# Patient Record
Sex: Female | Born: 1935 | Race: White | Hispanic: No | Smoking: Former smoker
Health system: Southern US, Community
[De-identification: ages and names within clinical notes are randomized; demographics above are authoritative.]

## PROBLEM LIST (undated history)

## (undated) DIAGNOSIS — Z972 Presence of dental prosthetic device (complete) (partial): Secondary | ICD-10-CM

## (undated) DIAGNOSIS — E785 Hyperlipidemia, unspecified: Secondary | ICD-10-CM

## (undated) DIAGNOSIS — E079 Disorder of thyroid, unspecified: Secondary | ICD-10-CM

## (undated) DIAGNOSIS — H269 Unspecified cataract: Secondary | ICD-10-CM

## (undated) DIAGNOSIS — Z973 Presence of spectacles and contact lenses: Secondary | ICD-10-CM

## (undated) DIAGNOSIS — G5600 Carpal tunnel syndrome, unspecified upper limb: Secondary | ICD-10-CM

## (undated) HISTORY — DX: Hyperlipidemia, unspecified: E78.5

## (undated) HISTORY — PX: TONSILLECTOMY: SUR1361

## (undated) HISTORY — DX: Disorder of thyroid, unspecified: E07.9

## (undated) HISTORY — PX: OTHER SURGICAL HISTORY: SHX169

---

## 1978-06-04 HISTORY — PX: DILATION AND CURETTAGE OF UTERUS: SHX78

## 2000-03-22 ENCOUNTER — Other Ambulatory Visit: Admission: RE | Admit: 2000-03-22 | Discharge: 2000-03-22 | Payer: Self-pay | Admitting: Family Medicine

## 2002-06-16 ENCOUNTER — Other Ambulatory Visit: Admission: RE | Admit: 2002-06-16 | Discharge: 2002-06-16 | Payer: Self-pay | Admitting: Family Medicine

## 2004-02-11 ENCOUNTER — Other Ambulatory Visit: Admission: RE | Admit: 2004-02-11 | Discharge: 2004-02-11 | Payer: Self-pay | Admitting: Family Medicine

## 2004-04-14 ENCOUNTER — Ambulatory Visit: Payer: Self-pay | Admitting: Family Medicine

## 2004-06-09 ENCOUNTER — Ambulatory Visit: Payer: Self-pay | Admitting: Family Medicine

## 2004-09-22 ENCOUNTER — Ambulatory Visit: Payer: Self-pay | Admitting: Family Medicine

## 2005-03-09 ENCOUNTER — Ambulatory Visit: Payer: Self-pay | Admitting: Family Medicine

## 2005-04-06 ENCOUNTER — Ambulatory Visit: Payer: Self-pay | Admitting: Family Medicine

## 2005-05-08 ENCOUNTER — Ambulatory Visit: Payer: Self-pay | Admitting: Family Medicine

## 2005-07-13 ENCOUNTER — Ambulatory Visit: Payer: Self-pay | Admitting: Family Medicine

## 2005-10-12 ENCOUNTER — Ambulatory Visit: Payer: Self-pay | Admitting: Family Medicine

## 2006-01-11 ENCOUNTER — Ambulatory Visit: Payer: Self-pay | Admitting: Family Medicine

## 2006-04-05 ENCOUNTER — Ambulatory Visit: Payer: Self-pay | Admitting: Family Medicine

## 2006-04-05 LAB — CONVERTED CEMR LAB
Albumin: 3.8 g/dL (ref 3.5–5.2)
Alkaline Phosphatase: 76 units/L (ref 39–117)
BUN: 13 mg/dL (ref 6–23)
CO2: 28 meq/L (ref 19–32)
Creatinine, Ser: 1 mg/dL (ref 0.4–1.2)
Sodium: 140 meq/L (ref 135–145)
Total Bilirubin: 0.7 mg/dL (ref 0.3–1.2)
Total Protein: 6.7 g/dL (ref 6.0–8.3)

## 2006-06-14 ENCOUNTER — Encounter: Payer: Self-pay | Admitting: Family Medicine

## 2006-06-14 ENCOUNTER — Other Ambulatory Visit: Admission: RE | Admit: 2006-06-14 | Discharge: 2006-06-14 | Payer: Self-pay | Admitting: Family Medicine

## 2006-06-14 ENCOUNTER — Ambulatory Visit: Payer: Self-pay | Admitting: Family Medicine

## 2006-06-14 LAB — CONVERTED CEMR LAB
ALT: 27 units/L (ref 0–40)
AST: 28 units/L (ref 0–37)
Alkaline Phosphatase: 75 units/L (ref 39–117)
BUN: 10 mg/dL (ref 6–23)
Basophils Relative: 0.5 % (ref 0.0–1.0)
Calcium: 9.7 mg/dL (ref 8.4–10.5)
Chloride: 109 meq/L (ref 96–112)
Eosinophil percent: 2.7 % (ref 0.0–5.0)
Glomerular Filtration Rate, Af Am: 80 mL/min/{1.73_m2}
HCT: 41.2 % (ref 36.0–46.0)
Hemoglobin: 13.9 g/dL (ref 12.0–15.0)
Lymphocytes Relative: 39.7 % (ref 12.0–46.0)
MCV: 95.3 fL (ref 78.0–100.0)
Neutrophils Relative %: 49.6 % (ref 43.0–77.0)
Platelets: 233 10*3/uL (ref 150–400)
Potassium: 4.7 meq/L (ref 3.5–5.1)
T3, Free: 2.6 pg/mL (ref 2.3–4.2)
TSH: 0.68 microintl units/mL (ref 0.35–5.50)
Total Protein: 6.9 g/dL (ref 6.0–8.3)
WBC: 4.6 10*3/uL (ref 4.5–10.5)

## 2006-06-15 ENCOUNTER — Encounter: Payer: Self-pay | Admitting: Family Medicine

## 2006-06-17 ENCOUNTER — Encounter: Payer: Self-pay | Admitting: Family Medicine

## 2006-07-05 ENCOUNTER — Ambulatory Visit: Payer: Self-pay | Admitting: Internal Medicine

## 2006-10-04 ENCOUNTER — Ambulatory Visit: Payer: Self-pay | Admitting: Family Medicine

## 2006-10-04 LAB — CONVERTED CEMR LAB
AST: 31 units/L (ref 0–37)
Albumin: 3.9 g/dL (ref 3.5–5.2)
Alkaline Phosphatase: 73 units/L (ref 39–117)

## 2006-10-07 ENCOUNTER — Encounter: Payer: Self-pay | Admitting: Family Medicine

## 2006-10-25 ENCOUNTER — Encounter: Payer: Self-pay | Admitting: Family Medicine

## 2006-10-30 DIAGNOSIS — E039 Hypothyroidism, unspecified: Secondary | ICD-10-CM | POA: Insufficient documentation

## 2006-11-11 ENCOUNTER — Encounter (INDEPENDENT_AMBULATORY_CARE_PROVIDER_SITE_OTHER): Payer: Self-pay | Admitting: *Deleted

## 2007-04-25 ENCOUNTER — Ambulatory Visit: Payer: Self-pay | Admitting: Family Medicine

## 2007-04-28 ENCOUNTER — Encounter (INDEPENDENT_AMBULATORY_CARE_PROVIDER_SITE_OTHER): Payer: Self-pay | Admitting: *Deleted

## 2007-04-28 LAB — CONVERTED CEMR LAB: TSH: 3.04 microintl units/mL (ref 0.35–5.50)

## 2007-06-03 ENCOUNTER — Telehealth (INDEPENDENT_AMBULATORY_CARE_PROVIDER_SITE_OTHER): Payer: Self-pay | Admitting: *Deleted

## 2007-06-13 ENCOUNTER — Telehealth (INDEPENDENT_AMBULATORY_CARE_PROVIDER_SITE_OTHER): Payer: Self-pay | Admitting: *Deleted

## 2007-07-08 ENCOUNTER — Encounter: Payer: Self-pay | Admitting: Family Medicine

## 2007-07-08 ENCOUNTER — Telehealth (INDEPENDENT_AMBULATORY_CARE_PROVIDER_SITE_OTHER): Payer: Self-pay | Admitting: *Deleted

## 2007-08-08 ENCOUNTER — Ambulatory Visit: Payer: Self-pay | Admitting: Family Medicine

## 2007-08-08 DIAGNOSIS — E785 Hyperlipidemia, unspecified: Secondary | ICD-10-CM

## 2007-08-11 ENCOUNTER — Encounter: Payer: Self-pay | Admitting: Family Medicine

## 2007-08-12 ENCOUNTER — Encounter: Payer: Self-pay | Admitting: Family Medicine

## 2007-08-18 ENCOUNTER — Encounter (INDEPENDENT_AMBULATORY_CARE_PROVIDER_SITE_OTHER): Payer: Self-pay | Admitting: *Deleted

## 2008-02-24 ENCOUNTER — Telehealth (INDEPENDENT_AMBULATORY_CARE_PROVIDER_SITE_OTHER): Payer: Self-pay | Admitting: *Deleted

## 2008-03-05 ENCOUNTER — Ambulatory Visit: Payer: Self-pay | Admitting: Family Medicine

## 2008-03-08 ENCOUNTER — Encounter (INDEPENDENT_AMBULATORY_CARE_PROVIDER_SITE_OTHER): Payer: Self-pay | Admitting: *Deleted

## 2008-03-09 ENCOUNTER — Encounter: Payer: Self-pay | Admitting: Family Medicine

## 2008-03-18 ENCOUNTER — Encounter (INDEPENDENT_AMBULATORY_CARE_PROVIDER_SITE_OTHER): Payer: Self-pay | Admitting: *Deleted

## 2008-04-02 ENCOUNTER — Telehealth (INDEPENDENT_AMBULATORY_CARE_PROVIDER_SITE_OTHER): Payer: Self-pay | Admitting: *Deleted

## 2008-07-16 ENCOUNTER — Ambulatory Visit: Payer: Self-pay | Admitting: Family Medicine

## 2008-07-25 LAB — CONVERTED CEMR LAB
ALT: 20 units/L (ref 0–35)
Bilirubin, Direct: 0.1 mg/dL (ref 0.0–0.3)
Cholesterol: 142 mg/dL (ref 0–200)
HDL: 60.3 mg/dL (ref >39.0)
Total Protein: 7.1 g/dL (ref 6.0–8.3)
Triglycerides: 81 mg/dL (ref 0–149)
VLDL: 16 mg/dL (ref 0–40)

## 2008-07-26 ENCOUNTER — Encounter (INDEPENDENT_AMBULATORY_CARE_PROVIDER_SITE_OTHER): Payer: Self-pay | Admitting: *Deleted

## 2008-08-06 ENCOUNTER — Ambulatory Visit: Payer: Self-pay | Admitting: Internal Medicine

## 2008-08-06 ENCOUNTER — Encounter: Payer: Self-pay | Admitting: Family Medicine

## 2008-08-26 ENCOUNTER — Encounter (INDEPENDENT_AMBULATORY_CARE_PROVIDER_SITE_OTHER): Payer: Self-pay | Admitting: *Deleted

## 2008-09-10 ENCOUNTER — Ambulatory Visit: Payer: Self-pay | Admitting: Family Medicine

## 2008-09-11 LAB — CONVERTED CEMR LAB: TSH: 1.59 microintl units/mL (ref 0.35–5.50)

## 2008-09-13 ENCOUNTER — Encounter (INDEPENDENT_AMBULATORY_CARE_PROVIDER_SITE_OTHER): Payer: Self-pay | Admitting: *Deleted

## 2008-09-14 ENCOUNTER — Telehealth (INDEPENDENT_AMBULATORY_CARE_PROVIDER_SITE_OTHER): Payer: Self-pay | Admitting: *Deleted

## 2009-01-07 ENCOUNTER — Ambulatory Visit: Payer: Self-pay | Admitting: Family Medicine

## 2009-01-11 ENCOUNTER — Encounter (INDEPENDENT_AMBULATORY_CARE_PROVIDER_SITE_OTHER): Payer: Self-pay | Admitting: *Deleted

## 2009-01-11 LAB — CONVERTED CEMR LAB
Alkaline Phosphatase: 76 units/L (ref 39–117)
Bilirubin, Direct: 0.1 mg/dL (ref 0.0–0.3)
Cholesterol: 146 mg/dL (ref 0–200)
LDL Cholesterol: 66 mg/dL (ref 0–99)
Total Protein: 7.3 g/dL (ref 6.0–8.3)

## 2009-03-18 ENCOUNTER — Ambulatory Visit: Payer: Self-pay | Admitting: Family Medicine

## 2009-07-15 ENCOUNTER — Ambulatory Visit: Payer: Self-pay | Admitting: Family Medicine

## 2009-07-15 DIAGNOSIS — M949 Disorder of cartilage, unspecified: Secondary | ICD-10-CM

## 2009-07-15 DIAGNOSIS — M899 Disorder of bone, unspecified: Secondary | ICD-10-CM

## 2009-07-15 DIAGNOSIS — M8580 Other specified disorders of bone density and structure, unspecified site: Secondary | ICD-10-CM | POA: Insufficient documentation

## 2009-07-18 ENCOUNTER — Encounter (INDEPENDENT_AMBULATORY_CARE_PROVIDER_SITE_OTHER): Payer: Self-pay | Admitting: *Deleted

## 2009-07-20 ENCOUNTER — Encounter (INDEPENDENT_AMBULATORY_CARE_PROVIDER_SITE_OTHER): Payer: Self-pay | Admitting: *Deleted

## 2009-07-20 ENCOUNTER — Ambulatory Visit: Payer: Self-pay | Admitting: Family Medicine

## 2009-07-20 LAB — CONVERTED CEMR LAB
OCCULT 1: NEGATIVE
OCCULT 3: NEGATIVE

## 2009-07-26 ENCOUNTER — Encounter: Payer: Self-pay | Admitting: Family Medicine

## 2009-12-12 ENCOUNTER — Telehealth: Payer: Self-pay | Admitting: Family Medicine

## 2009-12-23 ENCOUNTER — Ambulatory Visit: Payer: Self-pay | Admitting: Family Medicine

## 2009-12-26 LAB — CONVERTED CEMR LAB
ALT: 20 units/L (ref 0–35)
AST: 25 units/L (ref 0–37)
Alkaline Phosphatase: 72 units/L (ref 39–117)
Bilirubin, Direct: 0.1 mg/dL (ref 0.0–0.3)
Cholesterol: 145 mg/dL (ref 0–200)
Total Bilirubin: 0.8 mg/dL (ref 0.3–1.2)
Total Protein: 7 g/dL (ref 6.0–8.3)

## 2010-03-10 ENCOUNTER — Ambulatory Visit: Payer: Self-pay | Admitting: Family Medicine

## 2010-06-21 ENCOUNTER — Encounter: Payer: Self-pay | Admitting: Gastroenterology

## 2010-07-02 LAB — CONVERTED CEMR LAB
Albumin: 4.2 g/dL (ref 3.5–5.2)
BUN: 14 mg/dL (ref 6–23)
Basophils Absolute: 0 10*3/uL (ref 0.0–0.1)
Basophils Absolute: 0 10*3/uL (ref 0.0–0.1)
Basophils Relative: 0.1 % (ref 0.0–1.0)
Bilirubin Urine: NEGATIVE
Bilirubin, Direct: 0.1 mg/dL (ref 0.0–0.3)
Bilirubin, Direct: 0.1 mg/dL (ref 0.0–0.3)
CO2: 31 meq/L (ref 19–32)
CRP, High Sensitivity: <1 — ABNORMAL LOW (ref 0.00–5.00)
Cholesterol: 154 mg/dL (ref 0–200)
Creatinine, Ser: 0.9 mg/dL (ref 0.4–1.2)
Creatinine, Ser: 1 mg/dL (ref 0.4–1.2)
GFR calc non Af Amer: 57.88 mL/min (ref >60)
Glucose, Bld: 104 mg/dL — ABNORMAL HIGH (ref 70–99)
Glucose, Urine, Semiquant: NEGATIVE
HCT: 40.7 % (ref 36.0–46.0)
HCT: 42.1 % (ref 36.0–46.0)
HDL: 66.7 mg/dL (ref >39.00)
Hemoglobin: 13.9 g/dL (ref 12.0–15.0)
Ketones, urine, test strip: NEGATIVE
LDL Cholesterol: 72 mg/dL (ref 0–99)
Lymphocytes Relative: 29.3 % (ref 12.0–46.0)
Lymphs Abs: 1.6 10*3/uL (ref 0.7–4.0)
MCHC: 33.1 g/dL (ref 30.0–36.0)
MCV: 96.5 fL (ref 78.0–100.0)
Monocytes Absolute: 0.4 10*3/uL (ref 0.1–1.0)
Monocytes Absolute: 0.5 10*3/uL (ref 0.2–0.7)
Monocytes Relative: 7.2 % (ref 3.0–12.0)
Neutrophils Relative %: 60.3 % (ref 43.0–77.0)
Neutrophils Relative %: 61 % (ref 43.0–77.0)
Platelets: 218 10*3/uL (ref 150.0–400.0)
Potassium: 4.8 meq/L (ref 3.5–5.1)
Potassium: 5.3 meq/L — ABNORMAL HIGH (ref 3.5–5.1)
Protein, U semiquant: NEGATIVE
RDW: 12.2 % (ref 11.5–14.6)
RDW: 12.8 % (ref 11.5–14.6)
Sodium: 145 meq/L (ref 135–145)
TSH: 2.54 microintl units/mL (ref 0.35–5.50)
TSH: 2.92 microintl units/mL (ref 0.35–5.50)
Total Bilirubin: 0.5 mg/dL (ref 0.3–1.2)
Total Bilirubin: 0.8 mg/dL (ref 0.3–1.2)
Total Protein: 7 g/dL (ref 6.0–8.3)
Triglycerides: 79 mg/dL (ref 0.0–149.0)
Urobilinogen, UA: 0.2
VLDL: 15.8 mg/dL (ref 0.0–40.0)
Vit D, 25-Hydroxy: 41 ng/mL (ref 30–89)
WBC: 5.4 10*3/uL (ref 4.5–10.5)
pH: 5

## 2010-07-04 NOTE — Assessment & Plan Note (Signed)
Summary: cpx/ns/kdc   Vital Signs:  Patient profile:   76 year old female Height:      66.5 inches Weight:      187 pounds BMI:     29.84 Temp:     98.3 degrees F oral Pulse rate:   70 / minute Pulse rhythm:   regular BP sitting:   136 / 80  (left arm) Cuff size:   regular  Vitals Entered By: Army Fossa CMA (July 15, 2009 8:54 AM) CC: cpx, refill on zocor.    History of Present Illness: Pt here for cpe and labs.  no complaints.    Hyperlipidemia follow-up      This is a 76 year old woman who presents for Hyperlipidemia follow-up.  The patient denies muscle aches, GI upset, abdominal pain, flushing, itching, constipation, diarrhea, and fatigue.  The patient denies the following symptoms: chest pain/pressure, exercise intolerance, dypsnea, palpitations, syncope, and pedal edema.  Compliance with medications (by patient report) has been near 100%.  Dietary compliance has been good.  The patient reports exercising 3-4X per week.  Adjunctive measures currently used by the patient include ASA and weight reduction.    Preventive Screening-Counseling & Management  Alcohol-Tobacco     Alcohol drinks/day: 0     Smoking Status: quit     Year Quit: 75'     Pack years: 10     Passive Smoke Exposure: no  Caffeine-Diet-Exercise     Caffeine use/day: 1     Does Patient Exercise: yes     Type of exercise: GYM     Exercise (avg: min/session): 30-60     Times/week: 3  Hep-HIV-STD-Contraception     HIV Risk: no     Dental Visit-last 6 months yes     Dental Care Counseling: not indicated; dental care within six months     SBE monthly: yes     SBE Education/Counseling: not indicated; SBE done regularly  Safety-Violence-Falls     Seat Belt Use: 100      Sexual History:  widow.    Current Medications (verified): 1)  Levothyroxine Sodium 100 Mcg  Tabs (Levothyroxine Sodium) .Marland Kitchen.. 1 By Mouth Once Daily 2)  Zocor 40 Mg Tabs (Simvastatin) .Marland Kitchen.. 1 By Mouth At Bedtime 3)  Zostavax  [address] Unt/0.24ml Solr (Zoster Vaccine Live) .Marland Kitchen.. 1 Ml Im X1  Allergies (verified): No Known Drug Allergies  Past History:  Past Medical History: Last updated: 08/08/2007 Hypothyroidism Hyperlipidemia  Past Surgical History: Last updated: 08/08/2007 Tonsillectomy  Family History: Last updated: 07/15/2009 Family History Prostate Cancer Family HIstory hypothyroid M -- died at 74 yo--- dementia  Social History: Last updated: 08/08/2007 Former Smoker Occupation: [employer] Married-- widow Alcohol use-no Drug use-no Regular exercise-yes  Risk Factors: Alcohol Use: 0 (07/15/2009) Caffeine Use: 1 (07/15/2009) Exercise: yes (07/15/2009)  Risk Factors: Smoking Status: quit (07/15/2009) Passive Smoke Exposure: no (07/15/2009)  Family History: Reviewed history from 10/30/2006 and no changes required. Family History Prostate Cancer Family HIstory hypothyroid M -- died at 74 yo--- dementia  Social History: Reviewed history from 08/08/2007 and no changes required. Former Smoker Occupation: [employer] Married-- widow Alcohol use-no Drug use-no Regular exercise-yes Caffeine use/day:  1 Dental Care w/in 6 mos.:  yes Sexual History:  widow  Review of Systems      See HPI General:  Denies chills, fatigue, fever, loss of appetite, malaise, sleep disorder, sweats, weakness, and weight loss. Eyes:  Denies blurring, discharge, double vision, eye irritation, eye pain, halos, itching, light sensitivity, red  eye, vision loss-1 eye, and vision loss-both eyes; optho--q1y-- cataract right eye. ENT:  Denies decreased hearing, difficulty swallowing, ear discharge, earache, hoarseness, nasal congestion, nosebleeds, postnasal drainage, ringing in ears, sinus pressure, and sore throat. CV:  Denies bluish discoloration of lips or nails, chest pain or discomfort, difficulty breathing at night, difficulty breathing while lying down, fainting, fatigue, leg cramps with exertion, lightheadness,  near fainting, palpitations, shortness of breath with exertion, swelling of feet, swelling of hands, and weight gain. Resp:  Denies chest discomfort, chest pain with inspiration, cough, coughing up blood, excessive snoring, hypersomnolence, morning headaches, pleuritic, shortness of breath, sputum productive, and wheezing. GI:  Denies abdominal pain, bloody stools, change in bowel habits, constipation, dark tarry stools, diarrhea, excessive appetite, gas, hemorrhoids, indigestion, loss of appetite, nausea, vomiting, vomiting blood, and yellowish skin color. GU:  Denies abnormal vaginal bleeding, decreased libido, discharge, dysuria, genital sores, hematuria, incontinence, nocturia, urinary frequency, and urinary hesitancy. MS:  Denies joint pain, joint redness, joint swelling, loss of strength, low back pain, mid back pain, muscle aches, muscle , cramps, muscle weakness, stiffness, and thoracic pain. Derm:  Denies changes in color of skin, changes in nail beds, dryness, excessive perspiration, flushing, hair loss, insect bite(s), itching, lesion(s), poor wound healing, and rash. Neuro:  Denies brief paralysis, difficulty with concentration, disturbances in coordination, falling down, headaches, inability to speak, memory loss, numbness, poor balance, seizures, sensation of room spinning, tingling, tremors, visual disturbances, and weakness. Psych:  Denies alternate hallucination ( auditory/visual), anxiety, depression, easily angered, easily tearful, irritability, mental problems, panic attacks, sense of great danger, suicidal thoughts/plans, thoughts of violence, unusual visions or sounds, and thoughts /plans of harming others. Endo:  Denies cold intolerance, excessive hunger, excessive thirst, excessive urination, heat intolerance, polyuria, and weight change. Heme:  Denies abnormal bruising, bleeding, enlarge lymph nodes, fevers, pallor, and skin discoloration. Allergy:  Denies hives or rash, itching  eyes, persistent infections, seasonal allergies, and sneezing.  Physical Exam  General:  Well-developed,well-nourished,in no acute distress; alert,appropriate and cooperative throughout examination Head:  Normocephalic and atraumatic without obvious abnormalities. No apparent alopecia or balding. Eyes:  vision grossly intact, pupils equal, pupils round, pupils reactive to light, and no injection.   Ears:  External ear exam shows no significant lesions or deformities.  Otoscopic examination reveals clear canals, tympanic membranes are intact bilaterally without bulging, retraction, inflammation or discharge. Hearing is grossly normal bilaterally. Nose:  External nasal examination shows no deformity or inflammation. Nasal mucosa are pink and moist without lesions or exudates. Mouth:  Oral mucosa and oropharynx without lesions or exudates.  Teeth in good repair. Neck:  No deformities, masses, or tenderness noted. Chest Wall:  No deformities, masses, or tenderness noted. Breasts:  No mass, nodules, thickening, tenderness, bulging, retraction, inflamation, nipple discharge or skin changes noted.   Lungs:  Normal respiratory effort, chest expands symmetrically. Lungs are clear to auscultation, no crackles or wheezes. Heart:  normal rate and no murmur.   Abdomen:  Bowel sounds positive,abdomen soft and non-tender without masses, organomegaly or hernias noted. Rectal:  refused Genitalia:  refused Msk:  normal ROM, no joint tenderness, no joint swelling, no joint warmth, no redness over joints, no joint deformities, no joint instability, and no crepitation.   Pulses:  R posterior tibial normal, R dorsalis pedis normal, R carotid normal, L posterior tibial normal, L dorsalis pedis normal, and L carotid normal.   Extremities:  No clubbing, cyanosis, edema, or deformity noted with normal full range of motion of all  joints.   Neurologic:  No cranial nerve deficits noted. Station and gait are normal. Plantar  reflexes are down-going bilaterally. DTRs are symmetrical throughout. Sensory, motor and coordinative functions appear intact. Skin:  Intact without suspicious lesions or rashes Cervical Nodes:  No lymphadenopathy noted Axillary Nodes:  No palpable lymphadenopathy Psych:  Cognition and judgment appear intact. Alert and cooperative with normal attention span and concentration. No apparent delusions, illusions, hallucinations   Impression & Recommendations:  Problem # 1:  PREVENTIVE HEALTH CARE (ICD-V70.0)  Orders: Radiology Referral (Radiology) Venipuncture ([address]) TLB-Lipid Panel (80061-LIPID) TLB-BMP (Basic Metabolic Panel-BMET) (80048-METABOL) TLB-CBC Platelet - w/Differential (85025-CBCD) TLB-Hepatic/Liver Function Pnl (80076-HEPATIC) TLB-TSH (Thyroid Stimulating Hormone) (84443-TSH) T-Vitamin D (25-Hydroxy) (52841-32440) EKG w/ Interpretation (93000)  Problem # 2:  OSTEOPENIA (ICD-733.90)  The following medications were removed from the medication list:    Alendronate Sodium 70 Mg Tabs (Alendronate sodium) .Marland Kitchen... Take one tablet weekly  Orders: T-Vitamin D (25-Hydroxy) (10272-53664) EKG w/ Interpretation (93000) Prescription Created Electronically [phone])  Problem # 3:  HYPERLIPIDEMIA (ICD-272.4)  Her updated medication list for this problem includes:    Zocor 40 Mg Tabs (Simvastatin) .Marland Kitchen... 1 by mouth at bedtime  Orders: Venipuncture (42595) TLB-Lipid Panel (80061-LIPID) TLB-BMP (Basic Metabolic Panel-BMET) (80048-METABOL) TLB-CBC Platelet - w/Differential (85025-CBCD) TLB-Hepatic/Liver Function Pnl (80076-HEPATIC) TLB-TSH (Thyroid Stimulating Hormone) (84443-TSH) T-Vitamin D (25-Hydroxy) (63875-64332) EKG w/ Interpretation (93000) Prescription Created Electronically [phone])  Labs Reviewed: SGOT: 27 (01/07/2009)   SGPT: 20 (01/07/2009)   HDL:66.60 (01/07/2009), 60.3 (07/16/2008)  LDL:66 (01/07/2009), 66 (07/16/2008)  Chol:146 (01/07/2009), 142 (07/16/2008)   Trig:69.0 (01/07/2009), 81 (07/16/2008)  Problem # 4:  HYPOTHYROIDISM (ICD-244.9)  Her updated medication list for this problem includes:    Levothyroxine Sodium 100 Mcg Tabs (Levothyroxine sodium) .Marland Kitchen... 1 by mouth once daily  Orders: Venipuncture (41660) TLB-Lipid Panel (80061-LIPID) TLB-BMP (Basic Metabolic Panel-BMET) (80048-METABOL) TLB-CBC Platelet - w/Differential (85025-CBCD) TLB-Hepatic/Liver Function Pnl (80076-HEPATIC) TLB-TSH (Thyroid Stimulating Hormone) (84443-TSH) T-Vitamin D (25-Hydroxy) (63016-01093) EKG w/ Interpretation (93000) Prescription Created Electronically [phone])  Labs Reviewed: TSH: 1.59 (09/10/2008)    Chol: 146 (01/07/2009)   HDL: 66.60 (01/07/2009)   LDL: 66 (01/07/2009)   TG: 69.0 (01/07/2009)  Complete Medication List: 1)  Levothyroxine Sodium 100 Mcg Tabs (Levothyroxine sodium) .Marland Kitchen.. 1 by mouth once daily 2)  Zocor 40 Mg Tabs (Simvastatin) .Marland Kitchen.. 1 by mouth at bedtime 3)  Zostavax 32202 Unt/0.65ml Solr (Zoster vaccine live) .Marland Kitchen.. 1 ml im x1  Other Orders: Specimen Handling (54270) T-Culture, Urine (62376-28315) UA Dipstick w/o Micro (manual) (81002) Prescriptions: ZOCOR 40 MG TABS (SIMVASTATIN) 1 by mouth at bedtime  #30.0 Each x 5   Entered and Authorized by:   Loreen Freud DO   Signed by:   Loreen Freud DO on 07/15/2009   Method used:   Electronically to        [id number] (retail)       [address]       [place], [place]  [address]       Ph: [phone]       Fax: [phone]   RxID:   [id number] ZOSTAVAX 19400 UNT/0.65ML SOLR (ZOSTER VACCINE LIVE) 1 ml IM x1  #1 x 0   Entered and Authorized by:   Loreen Freud DO   Signed by:   Loreen Freud DO on 07/15/2009   Method used:   Print then Give to Patient   RxID:   [id number]  m,.   EKG  Procedure  date:  07/15/2009  Findings:      Normal sinus rhythm with rate of:  62 bpm     Laboratory Results   Urine  Tests   Date/Time Reported: July 15, 2009 10:05 AM   Routine Urinalysis   Color: lt. yellow Appearance: Clear Glucose: negative   (Normal Range: Negative) Bilirubin: negative   (Normal Range: Negative) Ketone: negative   (Normal Range: Negative) Spec. Gravity: <1.005   (Normal Range: 1.003-1.035) Blood: large   (Normal Range: Negative) pH: 5.0   (Normal Range: 5.0-8.0) Protein: negative   (Normal Range: Negative) Urobilinogen: 0.2   (Normal Range: 0-1) Nitrite: negative   (Normal Range: Negative) Leukocyte Esterace: negative   (Normal Range: Negative)    Comments: Floydene Flock  July 15, 2009 10:05 AM cx sent

## 2010-07-04 NOTE — Progress Notes (Signed)
Summary: Thyroid Medication Concerns  Phone Note From Pharmacy   Caller: [facility] [phone] Summary of Call: Message left on Triage VM: generic synthroid is avaliable BUT manufacture has changed-? ok to dispense med made by a different Manufacture. Please call to advise   [facility]  December 12, 2009 4:31 PM         Follow-up for Phone Call        pls advise...........Marland KitchenFelecia Deloach CMA  December 12, 2009 4:41 PM   Additional Follow-up for Phone Call Additional follow up Details #1::        I prefer brand name with synthroid for that reason. Additional Follow-up by: Loreen Freud DO,  December 12, 2009 4:48 PM    Additional Follow-up for Phone Call Additional follow up Details #2::    rx faxed to pharmacy..........Marland KitchenFelecia Deloach CMA  December 12, 2009 4:59 PM   New/Updated Medications: SYNTHROID 100 MCG TABS (LEVOTHYROXINE SODIUM) 1 by mouth once daily [BMN] Prescriptions: SYNTHROID 100 MCG TABS (LEVOTHYROXINE SODIUM) 1 by mouth once daily Brand medically necessary #30 x 11   Entered and Authorized by:   Loreen Freud DO   Signed by:   Loreen Freud DO on 12/12/2009   Method used:   Print then Give to Patient   RxID:   [id number]

## 2010-07-04 NOTE — Letter (Signed)
Summary: Primary Care Consult Scheduled Letter  [facility] [place]  [address] [place], [place] [address]   Phone: [phone]  Fax: [phone]      07/18/2009 MRN: [id number]  [facility] [address] [place], [place]  [address]    Dear Ms. Hubbard,    We have scheduled an appointment for you.  At the recommendation of Dr. Loreen Freud, we have scheduled you for a Screening Mammogram with [facility] on 07-26-2009 at 1:45pm.  Their address is [address], [address], [place] [address]. The office phone number is [phone].  If this appointment day and time is not convenient for you, please feel free to call the office of the doctor you are being referred to at the number listed above and reschedule the appointment.    It is important for you to keep your scheduled appointments. We are here to make sure you are given good patient care.   Thank you,    Renee, Patient Care Coordinator [facility]

## 2010-07-04 NOTE — Letter (Signed)
Summary: Results Follow up Letter  [facility] [place]  [address] [place], [place] [address]   Phone: [phone]  Fax: [phone]    07/20/2009 MRN: [id number]  [facility] [address] [place], [place]  [address]  Dear Ms. Ellerby,  The following are the results of your recent test(s):  Test         Result    Pap Smear:        Normal _____  Not Normal _____ Comments: ______________________________________________________ Cholesterol: LDL(Bad cholesterol):         Your goal is less than:         HDL (Good cholesterol):       Your goal is more than: Comments:  ______________________________________________________ Mammogram:        Normal _____  Not Normal _____ Comments:  ___________________________________________________________________ Hemoccult:        Normal __X___  Not normal _______ Comments:    _____________________________________________________________________ Other Tests:    We routinely do not discuss normal results over the telephone.  If you desire a copy of the results, or you have any questions about this information we can discuss them at your next office visit.   Sincerely,    Army Fossa CMA  July 20, 2009 2:07 PM

## 2010-07-04 NOTE — Assessment & Plan Note (Signed)
Summary: flu shot/cbs   Nurse Visit   Allergies: No Known Drug Allergies  Orders Added: 1)  Flu Vaccine 51yrs + MEDICARE PATIENTS [Q2039] 2)  Administration Flu vaccine - MCR [G0008]  Flu Vaccine Consent Questions     Do you have a history of severe allergic reactions to this vaccine? no    Any prior history of allergic reactions to egg and/or gelatin? no    Do you have a sensitivity to the preservative Thimersol? no    Do you have a past history of Guillan-Barre Syndrome? no    Do you currently have an acute febrile illness? no    Have you ever had a severe reaction to latex? no    Vaccine information given and explained to patient? yes    Are you currently pregnant? no    Lot Number:[id number]   Exp Date:12/02/2010   Site Given  Left Deltoid IM

## 2010-07-06 NOTE — Letter (Signed)
Summary: Colonoscopy Letter  [facility]  [address] [place], [place] [address]   Phone: [phone]  Fax: [phone]      June 21, 2010 MRN: [id number]   [facility] [address] [place], [place]  [address]   Dear Carla Reilly,   According to your medical record, it is time for you to schedule a Colonoscopy. The American Cancer Society recommends this procedure as a method to detect early colon cancer. Patients with a family history of colon cancer, or a personal history of colon polyps or inflammatory bowel disease are at increased risk.  This letter has been generated based on the recommendations made at the time of your procedure. If you feel that in your particular situation this may no longer apply, please contact our office.  Please call our office at [phone] to schedule this appointment or to update your records at your earliest convenience.  Thank you for cooperating with [place] to provide you with the very best care possible.   Sincerely,  Barbette Hair. Arlyce Dice, M.D.  [facility] [phone]

## 2010-07-07 ENCOUNTER — Other Ambulatory Visit (INDEPENDENT_AMBULATORY_CARE_PROVIDER_SITE_OTHER): Payer: Medicare Other

## 2010-07-07 ENCOUNTER — Other Ambulatory Visit: Payer: Self-pay | Admitting: Family Medicine

## 2010-07-07 ENCOUNTER — Encounter (INDEPENDENT_AMBULATORY_CARE_PROVIDER_SITE_OTHER): Payer: Self-pay | Admitting: *Deleted

## 2010-07-07 DIAGNOSIS — E785 Hyperlipidemia, unspecified: Secondary | ICD-10-CM

## 2010-07-07 LAB — LIPID PANEL
Cholesterol: 152 mg/dL (ref 0–200)
VLDL: 20.8 mg/dL (ref 0.0–40.0)

## 2010-07-07 LAB — HEPATIC FUNCTION PANEL
ALT: 19 U/L (ref 0–35)
AST: 22 U/L (ref 0–37)
Bilirubin, Direct: 0.1 mg/dL (ref 0.0–0.3)
Total Bilirubin: 0.7 mg/dL (ref 0.3–1.2)
Total Protein: 6.8 g/dL (ref 6.0–8.3)

## 2010-08-18 ENCOUNTER — Encounter: Payer: Self-pay | Admitting: Family Medicine

## 2010-09-08 ENCOUNTER — Encounter: Payer: Self-pay | Admitting: Family Medicine

## 2010-09-15 ENCOUNTER — Ambulatory Visit (INDEPENDENT_AMBULATORY_CARE_PROVIDER_SITE_OTHER): Payer: Medicare Other | Admitting: Family Medicine

## 2010-09-15 ENCOUNTER — Encounter: Payer: Self-pay | Admitting: Family Medicine

## 2010-09-15 VITALS — BP 102/60 | Temp 98.0°F | Ht 65.5 in | Wt 187.0 lb

## 2010-09-15 DIAGNOSIS — R319 Hematuria, unspecified: Secondary | ICD-10-CM

## 2010-09-15 DIAGNOSIS — E785 Hyperlipidemia, unspecified: Secondary | ICD-10-CM

## 2010-09-15 DIAGNOSIS — Z0000 Encounter for general adult medical examination without abnormal findings: Secondary | ICD-10-CM

## 2010-09-15 DIAGNOSIS — N390 Urinary tract infection, site not specified: Secondary | ICD-10-CM

## 2010-09-15 DIAGNOSIS — E039 Hypothyroidism, unspecified: Secondary | ICD-10-CM

## 2010-09-15 DIAGNOSIS — M949 Disorder of cartilage, unspecified: Secondary | ICD-10-CM

## 2010-09-15 DIAGNOSIS — M899 Disorder of bone, unspecified: Secondary | ICD-10-CM

## 2010-09-15 DIAGNOSIS — M8580 Other specified disorders of bone density and structure, unspecified site: Secondary | ICD-10-CM

## 2010-09-15 LAB — BASIC METABOLIC PANEL
BUN: 17 mg/dL (ref 6–23)
CO2: 28 mEq/L (ref 19–32)
Chloride: 106 mEq/L (ref 96–112)
Creatinine, Ser: 1 mg/dL (ref 0.4–1.2)
Glucose, Bld: 93 mg/dL (ref 70–99)

## 2010-09-15 LAB — CBC WITH DIFFERENTIAL/PLATELET
Basophils Absolute: 0 10*3/uL (ref 0.0–0.1)
Eosinophils Absolute: 0.1 10*3/uL (ref 0.0–0.7)
Eosinophils Relative: 2.1 % (ref 0.0–5.0)
HCT: 41 % (ref 36.0–46.0)
Lymphs Abs: 1.9 10*3/uL (ref 0.7–4.0)
MCHC: 34.5 g/dL (ref 30.0–36.0)
MCV: 95.4 fl (ref 78.0–100.0)
Monocytes Absolute: 0.5 10*3/uL (ref 0.1–1.0)
Platelets: 199 10*3/uL (ref 150.0–400.0)
RDW: 13.2 % (ref 11.5–14.6)

## 2010-09-15 LAB — POCT URINALYSIS DIPSTICK
Ketones, UA: NEGATIVE
Protein, UA: NEGATIVE
Spec Grav, UA: 1.015
pH, UA: 5

## 2010-09-15 LAB — LIPID PANEL
Cholesterol: 162 mg/dL (ref 0–200)
HDL: 67 mg/dL (ref >39.00)
Triglycerides: 113 mg/dL (ref 0.0–149.0)

## 2010-09-15 LAB — HEPATIC FUNCTION PANEL
AST: 23 U/L (ref 0–37)
Albumin: 4.1 g/dL (ref 3.5–5.2)
Alkaline Phosphatase: 82 U/L (ref 39–117)
Total Protein: 6.9 g/dL (ref 6.0–8.3)

## 2010-09-15 MED ORDER — ZOSTER VACCINE LIVE 19400 UNT/0.65ML ~~LOC~~ SOLR: 0.6500 mL | Freq: Once | SUBCUTANEOUS | Status: AC

## 2010-09-15 NOTE — Assessment & Plan Note (Signed)
Check labs con't meds 

## 2010-09-15 NOTE — Progress Notes (Signed)
  Subjective:     Carla Reilly is a 76 y.o. female and is here for a comprehensive physical exam. The patient reports no problems.  History   Social History  . Marital Status: Married    Spouse Name: N/A    Number of Children: 2  . Years of Education: 13   Occupational History  .  [employer]   Social History Main Topics  . Smoking status: Former Smoker    Quit date: 06/04/1972  . Smokeless tobacco: Not on file  . Alcohol Use: No  . Drug Use: No  . Sexually Active: Not on file   Other Topics Concern  . Not on file   Social History Narrative  . No narrative on file   Health Maintenance  Topic Date Due  . Zostavax  04/04/1997  . Influenza Vaccine  03/05/2011  . Tetanus/tdap  06/05/2011  . Colonoscopy  09/14/2020  . Pneumococcal Polysaccharide Vaccine Age 75 And Over  Completed    The following portions of the patient's history were reviewed and updated as appropriate: allergies, current medications, past family history, past medical history, past social history, past surgical history and problem list.  Review of Systems  Review of Systems  Constitutional: Negative for activity change, appetite change and fatigue.  HENT: Negative for hearing loss, congestion, tinnitus and ear discharge.   Eyes: Negative for visual disturbance (see optho q1y -- vision corrected to 20/20 with glasses).  Respiratory: Negative for cough, chest tightness and shortness of breath.   Cardiovascular: Negative for chest pain, palpitations and leg swelling.  Gastrointestinal: Negative for abdominal pain, diarrhea, constipation and abdominal distention.  Genitourinary: Negative for urgency, frequency, decreased urine volume and difficulty urinating.  Musculoskeletal: Negative for back pain, arthralgias and gait problem.  Skin: Negative for color change, pallor and rash.  Neurological: Negative for dizziness, light-headedness, numbness and headaches.  Hematological: Negative for adenopathy. Does not  bruise/bleed easily.  Psychiatric/Behavioral: Negative for suicidal ideas, confusion, sleep disturbance, self-injury, dysphoric mood, decreased concentration and agitation.  Pt is able to read and write and can do all ADLs No risk for falling No abuse/ violence in home  optho-- [facility]--Dr Laurell Josephs  Objective:    BP 102/60  Temp(Src) 98 F (36.7 C) (Oral)  Ht 5' 5.5" (1.664 m)  Wt 187 lb (84.823 kg)  BMI 30.65 kg/m2 General appearance: alert, cooperative, appears stated age and no distress Head: Normocephalic, without obvious abnormality, atraumatic Eyes: conjunctivae/corneas clear. PERRL, EOM's intact. Fundi benign. Ears: normal TM's and external ear canals both ears Nose: Nares normal. Septum midline. Mucosa normal. No drainage or sinus tenderness. Throat: lips, mucosa, and tongue normal; teeth and gums normal Neck: no adenopathy, no carotid bruit, no JVD, supple, symmetrical, trachea midline and thyroid not enlarged, symmetric, no tenderness/mass/nodules Lungs: clear to auscultation bilaterally Breasts: normal appearance, no masses or tenderness Heart: regular rate and rhythm, S1, S2 normal, no murmur, click, rub or gallop Abdomen: soft, non-tender; bowel sounds normal; no masses,  no organomegaly Pelvic: not done--pt request Extremities: extremities normal, atraumatic, no cyanosis or edema Pulses: 2+ and symmetric Skin: Skin color, texture, turgor normal. No rashes or lesions Lymph nodes: Cervical, supraclavicular, and axillary nodes normal. Neurologic: Grossly normal    Assessment:    Healthy female exam.     Plan:     See After Visit Summary for Counseling Recommendations

## 2010-09-15 NOTE — Assessment & Plan Note (Signed)
Calcium and vita D Check bmd

## 2010-09-15 NOTE — Assessment & Plan Note (Signed)
con't meds  Check labs 

## 2010-09-17 LAB — URINE CULTURE: Colony Count: 15000

## 2010-09-18 ENCOUNTER — Encounter: Payer: Self-pay | Admitting: *Deleted

## 2010-09-21 ENCOUNTER — Telehealth: Payer: Self-pay | Admitting: *Deleted

## 2010-09-21 NOTE — Telephone Encounter (Signed)
Discuss with patient, denies any symptom now.

## 2010-09-21 NOTE — Telephone Encounter (Addendum)
Pt return call will be available after 10 am tomorrow on home number. Left message to call office   Message copied by [facility] on Thu Sep 21, 2010  4:45 PM ------      Message from: Loreen Freud      Created: Mon Sep 18, 2010 12:09 PM       Contaminated--- if still symptomatic recheck

## 2010-09-22 ENCOUNTER — Telehealth: Payer: Self-pay

## 2010-09-22 NOTE — Telephone Encounter (Signed)
Called [facility] and left a message for [facility] to fax over the results to [facility]      KP

## 2010-09-22 NOTE — Telephone Encounter (Signed)
Message copied by Almeta Monas on Fri Sep 22, 2010  3:50 PM ------      Message from: Loreen Freud      Created: Fri Sep 15, 2010  8:44 AM       Pt had mammogram the last Monday in March--26th at [facility]).   Pt has not heard anything and we do not have the results

## 2010-09-25 ENCOUNTER — Encounter: Payer: Self-pay | Admitting: Family Medicine

## 2010-09-28 ENCOUNTER — Other Ambulatory Visit: Payer: Medicare Other

## 2010-09-28 ENCOUNTER — Other Ambulatory Visit (INDEPENDENT_AMBULATORY_CARE_PROVIDER_SITE_OTHER): Payer: Medicare Other | Admitting: Family Medicine

## 2010-09-28 DIAGNOSIS — Z1211 Encounter for screening for malignant neoplasm of colon: Secondary | ICD-10-CM

## 2010-09-28 LAB — FECAL OCCULT BLOOD, IMMUNOCHEMICAL: Fecal Occult Bld: NEGATIVE

## 2010-10-20 ENCOUNTER — Ambulatory Visit (INDEPENDENT_AMBULATORY_CARE_PROVIDER_SITE_OTHER)
Admission: RE | Admit: 2010-10-20 | Discharge: 2010-10-20 | Disposition: A | Discharge status: Home / Self Care | Payer: Medicare Other | Source: Ambulatory Visit

## 2010-10-20 DIAGNOSIS — M8580 Other specified disorders of bone density and structure, unspecified site: Secondary | ICD-10-CM

## 2010-10-20 DIAGNOSIS — M899 Disorder of bone, unspecified: Secondary | ICD-10-CM

## 2010-10-20 DIAGNOSIS — M949 Disorder of cartilage, unspecified: Secondary | ICD-10-CM

## 2010-11-02 ENCOUNTER — Telehealth: Payer: Self-pay

## 2010-11-02 NOTE — Telephone Encounter (Signed)
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  Pt aware of results and is already taking calcium and vitamin d 1000 units daily.

## 2010-11-02 NOTE — Telephone Encounter (Signed)
Called to patient and left message to call back------Bone density results back and Dr.Lowne suggests that patient takes 1200-1500 calcium and 1000 vitamin d daily. If she is then she needs to start Fosamax weekly and recheck BMD in 2 years.      KP

## 2010-11-06 NOTE — Telephone Encounter (Signed)
Can try atelvia 35mg  1 po qweek----we can give samples to try first and then she can check to see if her ins covers it

## 2010-11-07 NOTE — Telephone Encounter (Signed)
mssg left to contact the office    KP

## 2010-11-08 ENCOUNTER — Encounter: Payer: Self-pay | Admitting: Family Medicine

## 2010-11-09 NOTE — Telephone Encounter (Signed)
mssg left for return call     KP 

## 2010-11-10 NOTE — Telephone Encounter (Signed)
Pt states that she would not like to start any med at this time will continue to exercise and take vitamin.

## 2010-11-10 NOTE — Telephone Encounter (Signed)
Pt return call Left message to call office.  

## 2011-01-04 ENCOUNTER — Other Ambulatory Visit: Payer: Self-pay | Admitting: Family Medicine

## 2011-03-20 ENCOUNTER — Ambulatory Visit (INDEPENDENT_AMBULATORY_CARE_PROVIDER_SITE_OTHER): Payer: Medicare Other

## 2011-03-20 DIAGNOSIS — Z23 Encounter for immunization: Secondary | ICD-10-CM

## 2011-06-08 ENCOUNTER — Telehealth: Payer: Self-pay | Admitting: Family Medicine

## 2011-06-08 DIAGNOSIS — E785 Hyperlipidemia, unspecified: Secondary | ICD-10-CM

## 2011-06-08 NOTE — Telephone Encounter (Signed)
Patient is requesting to have labs done to check lipids preferably on a Friday morning. Please assist.

## 2011-06-08 NOTE — Telephone Encounter (Signed)
Lipid, hep 272.4

## 2011-06-08 NOTE — Telephone Encounter (Signed)
Order in the system and apt scheduled for Friday the 11th-future order in the system   KP

## 2011-06-15 ENCOUNTER — Other Ambulatory Visit (INDEPENDENT_AMBULATORY_CARE_PROVIDER_SITE_OTHER): Payer: Medicare Other

## 2011-06-15 DIAGNOSIS — E785 Hyperlipidemia, unspecified: Secondary | ICD-10-CM

## 2011-06-15 LAB — LIPID PANEL
Cholesterol: 159 mg/dL (ref 0–200)
Total CHOL/HDL Ratio: 2
Triglycerides: 101 mg/dL (ref 0.0–149.0)

## 2011-06-15 LAB — HEPATIC FUNCTION PANEL
AST: 23 U/L (ref 0–37)
Albumin: 4.1 g/dL (ref 3.5–5.2)
Alkaline Phosphatase: 69 U/L (ref 39–117)
Total Protein: 7 g/dL (ref 6.0–8.3)

## 2011-07-06 ENCOUNTER — Other Ambulatory Visit: Payer: Self-pay | Admitting: Family Medicine

## 2011-12-11 ENCOUNTER — Telehealth: Payer: Self-pay | Admitting: Family Medicine

## 2011-12-11 NOTE — Telephone Encounter (Signed)
lab order needed coming in Friday for - recheck 6 months----272.4 lipid, hep, per letter dated 1.2013

## 2011-12-14 ENCOUNTER — Other Ambulatory Visit (INDEPENDENT_AMBULATORY_CARE_PROVIDER_SITE_OTHER): Payer: Medicare Other

## 2011-12-14 DIAGNOSIS — E785 Hyperlipidemia, unspecified: Secondary | ICD-10-CM

## 2011-12-14 LAB — LIPID PANEL
HDL: 73.9 mg/dL (ref >39.00)
Total CHOL/HDL Ratio: 2
Triglycerides: 66 mg/dL (ref 0.0–149.0)
VLDL: 13.2 mg/dL (ref 0.0–40.0)

## 2011-12-14 LAB — HEPATIC FUNCTION PANEL
ALT: 17 U/L (ref 0–35)
Albumin: 4.1 g/dL (ref 3.5–5.2)
Total Bilirubin: 0.6 mg/dL (ref 0.3–1.2)

## 2011-12-14 NOTE — Progress Notes (Signed)
Labs only

## 2011-12-25 ENCOUNTER — Other Ambulatory Visit: Payer: Self-pay | Admitting: Family Medicine

## 2012-02-18 ENCOUNTER — Encounter: Payer: Self-pay | Admitting: Gastroenterology

## 2012-02-28 ENCOUNTER — Encounter: Payer: Self-pay | Admitting: Family Medicine

## 2012-04-02 ENCOUNTER — Ambulatory Visit: Payer: Medicare Other

## 2012-04-03 ENCOUNTER — Ambulatory Visit (INDEPENDENT_AMBULATORY_CARE_PROVIDER_SITE_OTHER): Payer: Medicare Other | Admitting: *Deleted

## 2012-04-03 DIAGNOSIS — Z23 Encounter for immunization: Secondary | ICD-10-CM

## 2012-06-09 ENCOUNTER — Ambulatory Visit: Payer: Medicare Other | Admitting: Family Medicine

## 2012-06-10 ENCOUNTER — Ambulatory Visit (INDEPENDENT_AMBULATORY_CARE_PROVIDER_SITE_OTHER): Payer: Medicare Other | Admitting: Family Medicine

## 2012-06-10 ENCOUNTER — Encounter: Payer: Self-pay | Admitting: Family Medicine

## 2012-06-10 VITALS — BP 120/70 | HR 74 | Temp 98.3°F | Wt 193.0 lb

## 2012-06-10 DIAGNOSIS — N898 Other specified noninflammatory disorders of vagina: Secondary | ICD-10-CM

## 2012-06-10 DIAGNOSIS — R319 Hematuria, unspecified: Secondary | ICD-10-CM | POA: Insufficient documentation

## 2012-06-10 LAB — POCT URINALYSIS DIPSTICK
Bilirubin, UA: NEGATIVE
Ketones, UA: NEGATIVE
Leukocytes, UA: NEGATIVE
Nitrite, UA: NEGATIVE
Protein, UA: NEGATIVE
pH, UA: 6

## 2012-06-10 NOTE — Progress Notes (Signed)
  Subjective:    Patient ID: Carla Reilly, female    DOB: 23-May-1935, 78 y.o.   MRN: [id number]  HPI Pt here c/o vaginal leakage.  She is not sure if it is from bladder or not.  No dysuria, no abd/pelvic pain.     Review of Systems as above   Objective:   Physical Exam BP 120/70  Pulse 74  Temp 98.3 F (36.8 C) (Oral)  Wt 193 lb (87.544 kg)  SpO2 91% General appearance: alert, cooperative, appears stated age and no distress Abdomen: soft, non-tender; bowel sounds normal; no masses,  no organomegaly Pelvic: cervix normal in appearance, external genitalia normal and vagina normal without discharge       Assessment & Plan:

## 2012-06-10 NOTE — Patient Instructions (Signed)
Hematuria, Adult  Hematuria (blood in your urine) can be caused by a bladder infection (cystitis), kidney infection (pyelonephritis), prostate infection (prostatitis), or kidney stone. Infections will usually respond to antibiotics (medications which kill germs), and a kidney stone will usually pass through your urine without further treatment. If you were put on antibiotics, take all the medicine until gone. You may feel better in a few days, but take all of your medicine or the infection may not respond and become more difficult to treat. If antibiotics were not given, an infection did not cause the blood in the urine. A further work up to find out the reason may be needed.  HOME CARE INSTRUCTIONS    Drink lots of fluid, 3 to 4 quarts a day. If you have been diagnosed with an infection, cranberry juice is especially recommended, in addition to large amounts of water.   Avoid caffeine, tea, and carbonated beverages, because they tend to irritate the bladder.   Avoid alcohol as it may irritate the prostate.   Only take over-the-counter or prescription medicines for pain, discomfort, or fever as directed by your caregiver.   If you have been diagnosed with a kidney stone follow your caregivers instructions regarding straining your urine to catch the stone.  TO PREVENT FURTHER INFECTIONS:   Empty the bladder often. Avoid holding urine for long periods of time.   After a bowel movement, women should cleanse front to back. Use each tissue only once.   Empty the bladder before and after sexual intercourse if you are a female.   Return to your caregiver if you develop back pain, fever, nausea (feeling sick to your stomach), vomiting, or your symptoms (problems) are not better in 3 days. Return sooner if you are getting worse.  If you have been requested to return for further testing make sure to keep your appointments. If an infection is not the cause of blood in your urine, X-rays may be required. Your caregiver  will discuss this with you.  SEEK IMMEDIATE MEDICAL CARE IF:    You have a persistent fever over 102 F (38.9 C).   You develop severe vomiting and are unable to keep the medication down.   You develop severe back or abdominal pain despite taking your medications.   You begin passing a large amount of blood or clots in your urine.   You feel extremely weak or faint, or pass out.  MAKE SURE YOU:    Understand these instructions.   Will watch your condition.   Will get help right away if you are not doing well or get worse.  Document Released: 05/21/2005 Document Revised: 08/13/2011 Document Reviewed: 01/08/2008  [employer] Patient Information 2013 [employer].

## 2012-06-10 NOTE — Assessment & Plan Note (Signed)
No vaginal d/c  Check culture Repeat in 2 weeks ---if blood still present--refer to urology

## 2012-06-12 LAB — URINE CULTURE: Colony Count: NO GROWTH

## 2012-06-18 ENCOUNTER — Telehealth: Payer: Self-pay | Admitting: *Deleted

## 2012-06-18 MED ORDER — METRONIDAZOLE 0.75 % VA GEL: 1.0000 | Freq: Every day | VAGINAL | Status: DC

## 2012-06-18 NOTE — Telephone Encounter (Signed)
Discuss with patient, Rx sent. 

## 2012-06-18 NOTE — Telephone Encounter (Signed)
Message copied by Verdene Rio on Wed Jun 18, 2012  5:29 PM ------      Message from: Lelon Perla      Created: Wed Jun 11, 2012  8:34 AM       + BV---- metrogel 1 applicator qhs x 5 nights

## 2012-06-19 ENCOUNTER — Other Ambulatory Visit: Payer: Self-pay | Admitting: Family Medicine

## 2012-06-20 ENCOUNTER — Other Ambulatory Visit (INDEPENDENT_AMBULATORY_CARE_PROVIDER_SITE_OTHER): Payer: Medicare Other

## 2012-06-20 DIAGNOSIS — E785 Hyperlipidemia, unspecified: Secondary | ICD-10-CM

## 2012-06-20 DIAGNOSIS — R319 Hematuria, unspecified: Secondary | ICD-10-CM

## 2012-06-20 LAB — LIPID PANEL
Cholesterol: 134 mg/dL (ref 0–200)
HDL: 60.6 mg/dL (ref >39.00)
VLDL: 17.6 mg/dL (ref 0.0–40.0)

## 2012-06-20 LAB — POCT URINALYSIS DIPSTICK
Bilirubin, UA: NEGATIVE
Glucose, UA: NEGATIVE
Ketones, UA: NEGATIVE
pH, UA: 5

## 2012-06-20 LAB — HEPATIC FUNCTION PANEL
ALT: 23 U/L (ref 0–35)
Alkaline Phosphatase: 77 U/L (ref 39–117)
Bilirubin, Direct: 0 mg/dL (ref 0.0–0.3)
Total Bilirubin: 0.5 mg/dL (ref 0.3–1.2)
Total Protein: 7.2 g/dL (ref 6.0–8.3)

## 2012-06-24 LAB — URINE CULTURE: Colony Count: 75000

## 2012-07-26 ENCOUNTER — Other Ambulatory Visit: Payer: Self-pay | Admitting: Family Medicine

## 2012-08-28 ENCOUNTER — Other Ambulatory Visit: Payer: Self-pay | Admitting: Family Medicine

## 2012-08-28 NOTE — Telephone Encounter (Signed)
Last OV 1-06-07-12, last TSH done 09-15-11 1.98 over a year. Last filled 07-26-12 #30Please advise

## 2012-09-17 ENCOUNTER — Other Ambulatory Visit: Payer: Self-pay | Admitting: Family Medicine

## 2012-09-26 ENCOUNTER — Other Ambulatory Visit: Payer: Self-pay | Admitting: Family Medicine

## 2012-10-24 ENCOUNTER — Other Ambulatory Visit: Payer: Self-pay | Admitting: Family Medicine

## 2012-11-21 ENCOUNTER — Other Ambulatory Visit: Payer: Self-pay | Admitting: Family Medicine

## 2012-11-28 ENCOUNTER — Other Ambulatory Visit (INDEPENDENT_AMBULATORY_CARE_PROVIDER_SITE_OTHER): Payer: Medicare Other

## 2012-11-28 DIAGNOSIS — E785 Hyperlipidemia, unspecified: Secondary | ICD-10-CM

## 2012-11-28 LAB — LIPID PANEL
LDL Cholesterol: 75 mg/dL (ref 0–99)
Total CHOL/HDL Ratio: 2
Triglycerides: 103 mg/dL (ref 0.0–149.0)

## 2012-11-28 LAB — HEPATIC FUNCTION PANEL
AST: 16 U/L (ref 0–37)
Alkaline Phosphatase: 76 U/L (ref 39–117)
Total Bilirubin: 0.5 mg/dL (ref 0.3–1.2)

## 2012-11-28 LAB — TSH: TSH: 2.07 u[IU]/mL (ref 0.35–5.50)

## 2012-12-08 ENCOUNTER — Telehealth: Payer: Self-pay | Admitting: Family Medicine

## 2012-12-08 MED ORDER — LEVOTHYROXINE SODIUM 100 MCG PO TABS: ORAL_TABLET | ORAL | Status: DC

## 2012-12-08 MED ORDER — SIMVASTATIN 40 MG PO TABS: ORAL_TABLET | ORAL | Status: DC

## 2012-12-08 NOTE — Telephone Encounter (Signed)
Patient called stating she just had labs done and needs new rx for levothyroxine and simvastatin sent to [facility] [address]. *Pt needs 90 day supply*

## 2013-03-31 ENCOUNTER — Ambulatory Visit (INDEPENDENT_AMBULATORY_CARE_PROVIDER_SITE_OTHER): Payer: Medicare Other | Admitting: General Practice

## 2013-03-31 DIAGNOSIS — Z23 Encounter for immunization: Secondary | ICD-10-CM

## 2013-05-22 ENCOUNTER — Other Ambulatory Visit (INDEPENDENT_AMBULATORY_CARE_PROVIDER_SITE_OTHER): Payer: Medicare Other

## 2013-05-22 DIAGNOSIS — E785 Hyperlipidemia, unspecified: Secondary | ICD-10-CM

## 2013-05-22 LAB — LIPID PANEL
HDL: 63.5 mg/dL (ref >39.00)
Total CHOL/HDL Ratio: 3
VLDL: 26.4 mg/dL (ref 0.0–40.0)

## 2013-05-22 LAB — HEPATIC FUNCTION PANEL
ALT: 20 U/L (ref 0–35)
Total Bilirubin: 0.6 mg/dL (ref 0.3–1.2)

## 2013-06-08 ENCOUNTER — Other Ambulatory Visit: Payer: Self-pay | Admitting: Family Medicine

## 2013-06-09 NOTE — Telephone Encounter (Signed)
Letter mailed to schedule CPE.     KP

## 2013-08-28 ENCOUNTER — Encounter: Payer: Medicare Other | Admitting: Family Medicine

## 2013-09-05 ENCOUNTER — Other Ambulatory Visit: Payer: Self-pay | Admitting: Family Medicine

## 2013-10-15 ENCOUNTER — Telehealth: Payer: Self-pay

## 2013-10-15 ENCOUNTER — Telehealth: Payer: Self-pay | Admitting: Family Medicine

## 2013-10-15 NOTE — Telephone Encounter (Signed)
Left message for call back Non-identifiable   CCS- 09/15/10 MMG- 02/15/12-negative BD- 10/20/10-osteopenia Flu- 03/31/13 Td- 06/04/01 PNA- 06/05/03

## 2013-10-15 NOTE — Telephone Encounter (Signed)
Medication and allergies:  Reviewed and updated  90 day supply/mail order: n/a Local pharmacy:  [id number] - [place], [address] AT [address]   Immunizations due: Td/Tdap   A/P: No changes to personal, family history or past surgical hx CCS- 09/15/10  MMG- 02/15/12-negative  BD- 10/20/10-osteopenia  Flu- 03/31/13  Td- 06/04/01- DUE PNA- 06/05/03 Shingles- received 3 years ago per patient   To Discuss with Provider: Nothing at this time.

## 2013-10-15 NOTE — Telephone Encounter (Signed)
Pt returned your call about visit tomorrow.

## 2013-10-16 ENCOUNTER — Ambulatory Visit (INDEPENDENT_AMBULATORY_CARE_PROVIDER_SITE_OTHER): Payer: Medicare Other | Admitting: Family Medicine

## 2013-10-16 ENCOUNTER — Encounter: Payer: Self-pay | Admitting: Family Medicine

## 2013-10-16 VITALS — BP 138/64 | HR 69 | Temp 98.2°F | Ht 65.0 in | Wt 202.0 lb

## 2013-10-16 DIAGNOSIS — R8290 Unspecified abnormal findings in urine: Secondary | ICD-10-CM

## 2013-10-16 DIAGNOSIS — E785 Hyperlipidemia, unspecified: Secondary | ICD-10-CM

## 2013-10-16 DIAGNOSIS — Z0000 Encounter for general adult medical examination without abnormal findings: Secondary | ICD-10-CM

## 2013-10-16 DIAGNOSIS — E039 Hypothyroidism, unspecified: Secondary | ICD-10-CM

## 2013-10-16 DIAGNOSIS — E669 Obesity, unspecified: Secondary | ICD-10-CM | POA: Insufficient documentation

## 2013-10-16 DIAGNOSIS — Z23 Encounter for immunization: Secondary | ICD-10-CM

## 2013-10-16 LAB — POCT URINALYSIS DIPSTICK
Bilirubin, UA: NEGATIVE
Glucose, UA: NEGATIVE
Ketones, UA: NEGATIVE
Leukocytes, UA: NEGATIVE
NITRITE UA: NEGATIVE
PH UA: 6
PROTEIN UA: NEGATIVE
SPEC GRAV UA: 1.01
UROBILINOGEN UA: 0.2

## 2013-10-16 LAB — HEPATIC FUNCTION PANEL
ALT: 30 U/L (ref 0–35)
AST: 33 U/L (ref 0–37)
Albumin: 4.1 g/dL (ref 3.5–5.2)
Alkaline Phosphatase: 68 U/L (ref 39–117)
BILIRUBIN DIRECT: 0.1 mg/dL (ref 0.0–0.3)
BILIRUBIN TOTAL: 0.7 mg/dL (ref 0.2–1.2)
Total Protein: 7 g/dL (ref 6.0–8.3)

## 2013-10-16 LAB — CBC WITH DIFFERENTIAL/PLATELET
BASOS ABS: 0 10*3/uL (ref 0.0–0.1)
Basophils Relative: 0.4 % (ref 0.0–3.0)
EOS ABS: 0.1 10*3/uL (ref 0.0–0.7)
EOS PCT: 2.7 % (ref 0.0–5.0)
HCT: 41.1 % (ref 36.0–46.0)
Hemoglobin: 13.7 g/dL (ref 12.0–15.0)
LYMPHS ABS: 1.7 10*3/uL (ref 0.7–4.0)
Lymphocytes Relative: 34.6 % (ref 12.0–46.0)
MCHC: 33.3 g/dL (ref 30.0–36.0)
MCV: 97.2 fl (ref 78.0–100.0)
MONO ABS: 0.4 10*3/uL (ref 0.1–1.0)
Monocytes Relative: 8.7 % (ref 3.0–12.0)
NEUTROS PCT: 53.6 % (ref 43.0–77.0)
Neutro Abs: 2.7 10*3/uL (ref 1.4–7.7)
PLATELETS: 232 10*3/uL (ref 150.0–400.0)
RBC: 4.23 Mil/uL (ref 3.87–5.11)
RDW: 13.6 % (ref 11.5–15.5)
WBC: 5 10*3/uL (ref 4.0–10.5)

## 2013-10-16 LAB — BASIC METABOLIC PANEL
BUN: 16 mg/dL (ref 6–23)
CHLORIDE: 109 meq/L (ref 96–112)
CO2: 27 mEq/L (ref 19–32)
Calcium: 9.7 mg/dL (ref 8.4–10.5)
Creatinine, Ser: 0.9 mg/dL (ref 0.4–1.2)
GFR: 63.79 mL/min (ref >60.00)
GLUCOSE: 104 mg/dL — AB (ref 70–99)
Potassium: 4.9 mEq/L (ref 3.5–5.1)
Sodium: 144 mEq/L (ref 135–145)

## 2013-10-16 LAB — LIPID PANEL
CHOL/HDL RATIO: 2
CHOLESTEROL: 151 mg/dL (ref 0–200)
HDL: 65.6 mg/dL (ref >39.00)
LDL CALC: 69 mg/dL (ref 0–99)
TRIGLYCERIDES: 82 mg/dL (ref 0.0–149.0)
VLDL: 16.4 mg/dL (ref 0.0–40.0)

## 2013-10-16 LAB — TSH: TSH: 0.64 u[IU]/mL (ref 0.35–4.50)

## 2013-10-16 MED ORDER — LEVOTHYROXINE SODIUM 100 MCG PO TABS: ORAL_TABLET | ORAL | Status: DC

## 2013-10-16 MED ORDER — EPINEPHRINE 0.3 MG/0.3ML IJ SOAJ: 0.3000 mg | Freq: Once | INTRAMUSCULAR | Status: DC

## 2013-10-16 MED ORDER — NONFORMULARY OR COMPOUNDED ITEM: Status: DC

## 2013-10-16 NOTE — Progress Notes (Signed)
Pre visit review using our clinic review tool, if applicable. No additional management support is needed unless otherwise documented below in the visit note. 

## 2013-10-16 NOTE — Addendum Note (Signed)
Addended by: Modena Morrow D on: 10/16/2013 04:52 PM   Modules accepted: Orders

## 2013-10-16 NOTE — Patient Instructions (Signed)

## 2013-10-16 NOTE — Progress Notes (Signed)
Subjective:    Carla Reilly is a 79 y.o. female who presents for Medicare Annual/Subsequent preventive examination.  Preventive Screening-Counseling & Management  Tobacco History  Smoking status  . Former Smoker  . Quit date: 06/04/1972  Smokeless tobacco  . Not on file     Problems Prior to Visit 1. none  Current Problems (verified) Patient Active Problem List   Diagnosis Date Noted  . Obesity (BMI 30-39.9) 10/16/2013  . Hematuria 06/10/2012  . OSTEOPENIA 07/15/2009  . HYPERLIPIDEMIA 08/08/2007  . HYPOTHYROIDISM 10/30/2006    Medications Prior to Visit Current Outpatient Prescriptions on File Prior to Visit  Medication Sig Dispense Refill  . Calcium Carbonate-Vitamin D (CALCIUM 600 + D PO) Take by mouth 2 (two) times daily.       . CHROMIUM PO Take 800 mcg by mouth daily.       . Coenzyme Q10 (VITALINE COQ10 PO) Take 600 mg by mouth.      . fish oil-omega-3 fatty acids 1000 MG capsule Take 1 g by mouth 2 (two) times daily.        Marland Kitchen GLUCOSAMINE PO Take 1,000 mg by mouth daily.       . Lutein 40 MG CAPS Take 1 capsule by mouth daily.      . metroNIDAZOLE (METROGEL) 0.75 % vaginal gel Place 1 Applicatorful vaginally at bedtime. For 5 nights  70 g  0  . Multiple Vitamin (MULTIVITAMIN) tablet Take 1 tablet by mouth daily.        . simvastatin (ZOCOR) 40 MG tablet TAKE 1 TABLET BY MOUTH EVERY NIGHT AT BEDTIME  90 tablet  0  . Specialty Vitamins Products (RA EAR CARE) TABS Take 4 tablets by mouth daily.      . vitamin B-12 (CYANOCOBALAMIN) 1000 MCG tablet Take 1,000 mcg by mouth daily.         No current facility-administered medications on file prior to visit.    Current Medications (verified) Current Outpatient Prescriptions  Medication Sig Dispense Refill  . Calcium Carbonate-Vitamin D (CALCIUM 600 + D PO) Take by mouth 2 (two) times daily.       . CHROMIUM PO Take 800 mcg by mouth daily.       . Coenzyme Q10 (VITALINE COQ10 PO) Take 600 mg by mouth.      . fish  oil-omega-3 fatty acids 1000 MG capsule Take 1 g by mouth 2 (two) times daily.        Marland Kitchen GLUCOSAMINE PO Take 1,000 mg by mouth daily.       Marland Kitchen levothyroxine (SYNTHROID, LEVOTHROID) 100 MCG tablet TAKE 1 TABLET BY MOUTH ONCE DAILY  90 tablet  3  . Lutein 40 MG CAPS Take 1 capsule by mouth daily.      . metroNIDAZOLE (METROGEL) 0.75 % vaginal gel Place 1 Applicatorful vaginally at bedtime. For 5 nights  70 g  0  . Multiple Vitamin (MULTIVITAMIN) tablet Take 1 tablet by mouth daily.        . NON FORMULARY Place 1 suppository vaginally daily. Avel Sensor      . simvastatin (ZOCOR) 40 MG tablet TAKE 1 TABLET BY MOUTH EVERY NIGHT AT BEDTIME  90 tablet  0  . Specialty Vitamins Products (RA EAR CARE) TABS Take 4 tablets by mouth daily.      . vitamin B-12 (CYANOCOBALAMIN) 1000 MCG tablet Take 1,000 mcg by mouth daily.        Marland Kitchen EPINEPHrine 0.3 mg/0.3 mL IJ SOAJ injection Inject 0.3 mLs (0.3  mg total) into the muscle once.  1 Device  1  . NONFORMULARY OR COMPOUNDED ITEM Tdap  #1  IM x1  1 each  0   No current facility-administered medications for this visit.     Allergies (verified) Yellow jacket venom   PAST HISTORY  Family History Family History  Problem Relation Age of Onset  . Prostate cancer Father   . Cancer Father     prostate  . Hypothyroidism Mother   . Dementia Mother     died at 90yo  . Hypertension Brother     Social History History  Substance Use Topics  . Smoking status: Former Smoker    Quit date: 06/04/1972  . Smokeless tobacco: Not on file  . Alcohol Use: No     Are there smokers in your home (other than you)? No  Risk Factors Current exercise habits: The patient does not participate in regular exercise at present.  Dietary issues discussed: na   Cardiac risk factors: advanced age (older than 19 for men, 37 for women), dyslipidemia, obesity (BMI >= 30 kg/m2) and sedentary lifestyle.  Depression Screen (Note: if answer to either of the following is "Yes", a more  complete depression screening is indicated)   Over the past two weeks, have you felt down, depressed or hopeless? No  Over the past two weeks, have you felt little interest or pleasure in doing things? No  Have you lost interest or pleasure in daily life? No  Do you often feel hopeless? No  Do you cry easily over simple problems? No  Activities of Daily Living In your present state of health, do you have any difficulty performing the following activities?:  Driving? No Managing money?  No Feeding yourself? No Getting from bed to chair? No Climbing a flight of stairs? No Preparing food and eating?: No Bathing or showering? No Getting dressed: No Getting to the toilet? No Using the toilet:No Moving around from place to place: No In the past year have you fallen or had a near fall?:No   Are you sexually active?  Yes  Do you have more than one partner?  No  Hearing Difficulties: No Do you often ask people to speak up or repeat themselves? No Do you experience ringing or noises in your ears? No Do you have difficulty understanding soft or whispered voices? No   Do you feel that you have a problem with memory? No  Do you often misplace items? No  Do you feel safe at home?  Yes  Cognitive Testing  Alert? Yes  Normal Appearance?Yes  Oriented to person? Yes  Place? Yes   Time? Yes  Recall of three objects?  Yes  Can perform simple calculations? Yes  Displays appropriate judgment?Yes  Can read the correct time from a watch face?Yes   Advanced Directives have been discussed with the patient? Yes  List the Names of Other Physician/Practitioners you currently use: 1.  opth 2. dentist  Indicate any recent Medical Services you may have received from other than [facility] providers in the past year (date may be approximate).  Immunization History  Administered Date(s) Administered  . Influenza Split 03/20/2011, 04/03/2012  . Influenza Whole 04/04/2006, 04/25/2007, 03/05/2008,  03/18/2009, 03/10/2010  . Influenza,inj,Quad PF,36+ Mos 03/31/2013  . Pneumococcal Polysaccharide-23 06/05/2003  . Td 06/04/2001  . Zoster 10/16/2010    Screening Tests Health Maintenance  Topic Date Due  . Tetanus/tdap  06/05/2011  . Influenza Vaccine  01/02/2014  . Colonoscopy  09/14/2020  .  Pneumococcal Polysaccharide Vaccine Age 2 And Over  Completed  . Zostavax  Completed    All answers were reviewed with the patient and necessary referrals were made:  Garnet Koyanagi, DO   10/16/2013   History reviewed:  She  has a past medical history of Hyperlipidemia and Thyroid disease. She  does not have any pertinent problems on file. She  has past surgical history that includes Tonsillectomy and Dilation and curettage of uterus (1980). Her family history includes Cancer in her father; Dementia in her mother; Hypertension in her brother; Hypothyroidism in her mother; Prostate cancer in her father. She  reports that she quit smoking about 41 years ago. She does not have any smokeless tobacco history on file. She reports that she does not drink alcohol or use illicit drugs. She has a current medication list which includes the following prescription(s): calcium carb-cholecalciferol, chromium, coenzyme q10, fish oil-omega-3 fatty acids, glucosamine hcl, levothyroxine, lutein, metronidazole, multivitamin, NON FORMULARY, simvastatin, ra ear care, vitamin b-12, epinephrine, and NONFORMULARY OR COMPOUNDED ITEM. Current Outpatient Prescriptions on File Prior to Visit  Medication Sig Dispense Refill  . Calcium Carbonate-Vitamin D (CALCIUM 600 + D PO) Take by mouth 2 (two) times daily.       . CHROMIUM PO Take 800 mcg by mouth daily.       . Coenzyme Q10 (VITALINE COQ10 PO) Take 600 mg by mouth.      . fish oil-omega-3 fatty acids 1000 MG capsule Take 1 g by mouth 2 (two) times daily.        Marland Kitchen GLUCOSAMINE PO Take 1,000 mg by mouth daily.       . Lutein 40 MG CAPS Take 1 capsule by mouth daily.       . metroNIDAZOLE (METROGEL) 0.75 % vaginal gel Place 1 Applicatorful vaginally at bedtime. For 5 nights  70 g  0  . Multiple Vitamin (MULTIVITAMIN) tablet Take 1 tablet by mouth daily.        . simvastatin (ZOCOR) 40 MG tablet TAKE 1 TABLET BY MOUTH EVERY NIGHT AT BEDTIME  90 tablet  0  . Specialty Vitamins Products (RA EAR CARE) TABS Take 4 tablets by mouth daily.      . vitamin B-12 (CYANOCOBALAMIN) 1000 MCG tablet Take 1,000 mcg by mouth daily.         No current facility-administered medications on file prior to visit.   She is allergic to yellow jacket venom.  Review of Systems Pertinent items are noted in HPI.    Objective:     Vision by Snellen chart:   Body mass index is 33.61 kg/(m^2). BP 138/64  Pulse 69  Temp(Src) 98.2 F (36.8 C) (Oral)  Ht 5\' 5"  (1.651 m)  Wt 202 lb (91.627 kg)  BMI 33.61 kg/m2  SpO2 98%  BP 138/64  Pulse 69  Temp(Src) 98.2 F (36.8 C) (Oral)  Ht 5\' 5"  (1.651 m)  Wt 202 lb (91.627 kg)  BMI 33.61 kg/m2  SpO2 98% General appearance: alert, cooperative, appears stated age and no distress Head: Normocephalic, without obvious abnormality, atraumatic Eyes: conjunctivae/corneas clear. PERRL, EOM's intact. Fundi benign. Ears: normal TM's and external ear canals both ears Nose: Nares normal. Septum midline. Mucosa normal. No drainage or sinus tenderness. Throat: lips, mucosa, and tongue normal; teeth and gums normal Neck: no adenopathy, no carotid bruit, no JVD, supple, symmetrical, trachea midline and thyroid not enlarged, symmetric, no tenderness/mass/nodules Back: symmetric, no curvature. ROM normal. No CVA tenderness. Lungs: clear to auscultation bilaterally  Breasts: normal appearance, no masses or tenderness Heart: regular rate and rhythm, S1, S2 normal, no murmur, click, rub or gallop Abdomen: soft, non-tender; bowel sounds normal; no masses,  no organomegaly Pelvic: not indicated; post-menopausal, no abnormal Pap smears in  past Extremities: extremities normal, atraumatic, no cyanosis or edema Pulses: 2+ and symmetric Skin: Skin color, texture, turgor normal. No rashes or lesions Lymph nodes: Cervical, supraclavicular, and axillary nodes normal. Neurologic: Alert and oriented X 3, normal strength and tone. Normal symmetric reflexes. Normal coordination and gait     Assessment:     cpe      Plan:     During the course of the visit the patient was educated and counseled about appropriate screening and preventive services including:    Pneumococcal vaccine   Influenza vaccine  Td vaccine  Screening mammography  Screening Pap smear and pelvic exam   Bone densitometry screening  Colorectal cancer screening  Advanced directives: has an advanced directive - a copy HAS NOT been provided.  Diet review for nutrition referral? Yes ____  Not Indicated __x__   Patient Instructions (the written plan) was given to the patient.  Medicare Attestation I have personally reviewed: The patient's medical and social history Their use of alcohol, tobacco or illicit drugs Their current medications and supplements The patient's functional ability including ADLs,fall risks, home safety risks, cognitive, and hearing and visual impairment Diet and physical activities Evidence for depression or mood disorders  The patient's weight, height, BMI, and visual acuity have been recorded in the chart.  I have made referrals, counseling, and provided education to the patient based on review of the above and I have provided the patient with a written personalized care plan for preventive services.    1. Need for prophylactic vaccination with combined diphtheria-tetanus-pertussis (DTP) vaccine   - NONFORMULARY OR COMPOUNDED ITEM; Tdap  #1  IM x1  Dispense: 1 each; Refill: 0  2. Other and unspecified hyperlipidemia Check labs, con't meds - Basic metabolic panel - CBC with Differential - Hepatic function panel -  Lipid panel - POCT urinalysis dipstick  3. Unspecified hypothyroidism Check labs, con't meds - TSH   Garnet Koyanagi, DO   10/16/2013

## 2013-10-19 LAB — URINE CULTURE: Colony Count: 25000

## 2013-12-10 ENCOUNTER — Other Ambulatory Visit: Payer: Self-pay | Admitting: Family Medicine

## 2013-12-11 ENCOUNTER — Other Ambulatory Visit: Payer: Self-pay | Admitting: Family Medicine

## 2013-12-11 NOTE — Telephone Encounter (Signed)
Med filled.  

## 2014-03-04 ENCOUNTER — Other Ambulatory Visit (INDEPENDENT_AMBULATORY_CARE_PROVIDER_SITE_OTHER): Payer: Medicare Other

## 2014-03-04 ENCOUNTER — Ambulatory Visit (INDEPENDENT_AMBULATORY_CARE_PROVIDER_SITE_OTHER): Payer: Medicare Other

## 2014-03-04 DIAGNOSIS — E785 Hyperlipidemia, unspecified: Secondary | ICD-10-CM

## 2014-03-04 DIAGNOSIS — Z23 Encounter for immunization: Secondary | ICD-10-CM

## 2014-03-04 LAB — HEPATIC FUNCTION PANEL
ALT: 18 U/L (ref 0–35)
AST: 21 U/L (ref 0–37)
Albumin: 4 g/dL (ref 3.5–5.2)
Alkaline Phosphatase: 79 U/L (ref 39–117)
BILIRUBIN DIRECT: 0.1 mg/dL (ref 0.0–0.3)
Total Bilirubin: 0.7 mg/dL (ref 0.2–1.2)
Total Protein: 7.4 g/dL (ref 6.0–8.3)

## 2014-03-04 LAB — LIPID PANEL
CHOL/HDL RATIO: 3
Cholesterol: 167 mg/dL (ref 0–200)
HDL: 56.9 mg/dL (ref >39.00)
LDL CALC: 83 mg/dL (ref 0–99)
NonHDL: 110.1
Triglycerides: 134 mg/dL (ref 0.0–149.0)
VLDL: 26.8 mg/dL (ref 0.0–40.0)

## 2014-03-09 ENCOUNTER — Other Ambulatory Visit: Payer: Self-pay | Admitting: Family Medicine

## 2014-04-16 ENCOUNTER — Encounter: Payer: Self-pay | Admitting: Family Medicine

## 2014-04-16 ENCOUNTER — Other Ambulatory Visit (HOSPITAL_COMMUNITY)
Admission: RE | Admit: 2014-04-16 | Discharge: 2014-04-16 | Disposition: A | Discharge status: Home / Self Care | Payer: Medicare Other | Source: Ambulatory Visit | Attending: Family Medicine | Admitting: Family Medicine

## 2014-04-16 ENCOUNTER — Ambulatory Visit (INDEPENDENT_AMBULATORY_CARE_PROVIDER_SITE_OTHER): Payer: Medicare Other | Admitting: Family Medicine

## 2014-04-16 VITALS — BP 138/62 | HR 76 | Temp 98.4°F | Wt 200.0 lb

## 2014-04-16 DIAGNOSIS — B9689 Other specified bacterial agents as the cause of diseases classified elsewhere: Secondary | ICD-10-CM

## 2014-04-16 DIAGNOSIS — R319 Hematuria, unspecified: Secondary | ICD-10-CM

## 2014-04-16 DIAGNOSIS — N760 Acute vaginitis: Secondary | ICD-10-CM | POA: Insufficient documentation

## 2014-04-16 DIAGNOSIS — N939 Abnormal uterine and vaginal bleeding, unspecified: Secondary | ICD-10-CM

## 2014-04-16 DIAGNOSIS — A499 Bacterial infection, unspecified: Secondary | ICD-10-CM

## 2014-04-16 LAB — POCT URINALYSIS DIPSTICK
BILIRUBIN UA: NEGATIVE
GLUCOSE UA: NEGATIVE
Ketones, UA: NEGATIVE
Nitrite, UA: NEGATIVE
Urobilinogen, UA: 2
pH, UA: 5.5

## 2014-04-16 MED ORDER — METRONIDAZOLE 0.75 % VA GEL: 1.0000 | Freq: Two times a day (BID) | VAGINAL | Status: DC

## 2014-04-16 NOTE — Progress Notes (Signed)
Pre visit review using our clinic review tool, if applicable. No additional management support is needed unless otherwise documented below in the visit note. 

## 2014-04-16 NOTE — Patient Instructions (Signed)

## 2014-04-16 NOTE — Progress Notes (Signed)
   Subjective:    Patient ID: Carla Reilly, female    DOB: [birth year], 79 y.o.   MRN: [id number]  HPI Pt here c/o vaginal bleeding and d/c with no odor.  Pt states this seems like the same things that happened last year. No abd pain.   Review of Systems    as above Objective:   Physical Exam BP 138/62 mmHg  Pulse 76  Temp(Src) 98.4 F (36.9 C) (Oral)  Wt 200 lb (90.719 kg)  SpO2 98% General appearance: alert, cooperative, appears stated age and no distress Neck: no adenopathy, supple, symmetrical, trachea midline and thyroid not enlarged, symmetric, no tenderness/mass/nodules Back: symmetric, no curvature. ROM normal. No CVA tenderness. Lungs: clear to auscultation bilaterally Heart: S1, S2 normal Pelvic: cervix normal in appearance, external genitalia normal and positive findings: vaginal discharge:  scant, white, thin and malodorous Extremities: extremities normal, atraumatic, no cyanosis or edema       Assessment & Plan:  1. Hematuria  - POCT Urinalysis Dipstick - Urine Culture  2. Bacterial vaginitis  - metroNIDAZOLE (METROGEL VAGINAL) 0.75 % vaginal gel; Place 1 Applicatorful vaginally 2 (two) times daily.  Dispense: 70 g; Refill: 0 - Cervicovaginal ancillary only  3. Vaginal bleeding Refer to gyn for eval --- may need urology referral as well  - Ambulatory referral to Gynecology

## 2014-04-19 LAB — CERVICOVAGINAL ANCILLARY ONLY
WET PREP (BD AFFIRM): NEGATIVE
WET PREP (BD AFFIRM): NEGATIVE
WET PREP (BD AFFIRM): NEGATIVE

## 2014-04-23 ENCOUNTER — Other Ambulatory Visit: Payer: Self-pay | Admitting: Family Medicine

## 2014-04-23 ENCOUNTER — Telehealth: Payer: Self-pay | Admitting: Family Medicine

## 2014-04-23 DIAGNOSIS — N939 Abnormal uterine and vaginal bleeding, unspecified: Secondary | ICD-10-CM

## 2014-04-23 NOTE — Telephone Encounter (Signed)
New referral put in

## 2014-04-23 NOTE — Telephone Encounter (Signed)
Please advise      KP 

## 2014-04-23 NOTE — Telephone Encounter (Signed)
Caller name: Verdia Relation to pt: self Call back number: [phone] Pharmacy:  Reason for call:   [facility] does not participate with her ins and needs a referral elsewhere.

## 2014-05-25 ENCOUNTER — Other Ambulatory Visit: Payer: Self-pay

## 2014-05-25 MED ORDER — LEVOTHYROXINE SODIUM 100 MCG PO TABS: 100.0000 ug | ORAL_TABLET | Freq: Every day | ORAL | Status: DC

## 2014-05-25 MED ORDER — SIMVASTATIN 40 MG PO TABS: 40.0000 mg | ORAL_TABLET | Freq: Every day | ORAL | Status: DC

## 2014-05-25 NOTE — Telephone Encounter (Signed)
Called the patient to confirm she wanted Med's to go to [facility] and she said yes. Both Sent.     KP

## 2014-08-09 ENCOUNTER — Telehealth: Payer: Self-pay

## 2014-08-09 NOTE — Telephone Encounter (Signed)
She is due for a CPE in May. Please schedule that and Dr.Lowne can do all of the labs at once, since her labs are not due until April.     KP

## 2014-08-09 NOTE — Telephone Encounter (Signed)
-----   Message from Oneta Rack sent at 08/06/2014  9:54 AM EST ----- Regarding: lab orders  Pt scheduled a lab appointment for cholesterol check as per MD advise. Please advise if this is ok requesting orders. (lab only appointment)?

## 2014-08-10 NOTE — Telephone Encounter (Signed)
Pt scheduled physical appointment for May

## 2014-08-12 ENCOUNTER — Other Ambulatory Visit: Payer: Self-pay

## 2014-10-21 ENCOUNTER — Other Ambulatory Visit: Payer: Self-pay | Admitting: Family Medicine

## 2014-10-22 ENCOUNTER — Encounter: Payer: Self-pay | Admitting: Family Medicine

## 2014-11-12 ENCOUNTER — Telehealth: Payer: Self-pay | Admitting: Family Medicine

## 2014-11-12 NOTE — Telephone Encounter (Signed)
Pre Visit letter sent  °

## 2014-12-02 ENCOUNTER — Telehealth: Payer: Self-pay | Admitting: *Deleted

## 2014-12-02 NOTE — Telephone Encounter (Signed)
Appointment confirmed with patient.

## 2014-12-03 ENCOUNTER — Ambulatory Visit (INDEPENDENT_AMBULATORY_CARE_PROVIDER_SITE_OTHER): Payer: Medicare Other | Admitting: Family Medicine

## 2014-12-03 ENCOUNTER — Encounter: Payer: Self-pay | Admitting: Family Medicine

## 2014-12-03 VITALS — BP 126/76 | HR 76 | Temp 98.8°F | Ht 65.0 in | Wt 200.8 lb

## 2014-12-03 DIAGNOSIS — E785 Hyperlipidemia, unspecified: Secondary | ICD-10-CM | POA: Diagnosis not present

## 2014-12-03 DIAGNOSIS — Z23 Encounter for immunization: Secondary | ICD-10-CM

## 2014-12-03 DIAGNOSIS — E039 Hypothyroidism, unspecified: Secondary | ICD-10-CM

## 2014-12-03 DIAGNOSIS — Z0000 Encounter for general adult medical examination without abnormal findings: Secondary | ICD-10-CM

## 2014-12-03 DIAGNOSIS — Z1239 Encounter for other screening for malignant neoplasm of breast: Secondary | ICD-10-CM

## 2014-12-03 DIAGNOSIS — E2839 Other primary ovarian failure: Secondary | ICD-10-CM

## 2014-12-03 LAB — CBC WITH DIFFERENTIAL/PLATELET
BASOS PCT: 0 % (ref 0–1)
Basophils Absolute: 0 10*3/uL (ref 0.0–0.1)
EOS PCT: 2 % (ref 0–5)
Eosinophils Absolute: 0.1 10*3/uL (ref 0.0–0.7)
HCT: 42.7 % (ref 36.0–46.0)
Hemoglobin: 14 g/dL (ref 12.0–15.0)
Lymphocytes Relative: 47 % — ABNORMAL HIGH (ref 12–46)
Lymphs Abs: 2.9 10*3/uL (ref 0.7–4.0)
MCH: 31.3 pg (ref 26.0–34.0)
MCHC: 32.8 g/dL (ref 30.0–36.0)
MCV: 95.3 fL (ref 78.0–100.0)
MONO ABS: 0.5 10*3/uL (ref 0.1–1.0)
MPV: 9.6 fL (ref 8.6–12.4)
Monocytes Relative: 9 % (ref 3–12)
Neutro Abs: 2.6 10*3/uL (ref 1.7–7.7)
Neutrophils Relative %: 42 % — ABNORMAL LOW (ref 43–77)
Platelets: 231 10*3/uL (ref 150–400)
RBC: 4.48 MIL/uL (ref 3.87–5.11)
RDW: 13.6 % (ref 11.5–15.5)
WBC: 6.1 10*3/uL (ref 4.0–10.5)

## 2014-12-03 LAB — HEPATIC FUNCTION PANEL
ALBUMIN: 4.2 g/dL (ref 3.5–5.2)
ALT: 19 U/L (ref 0–35)
AST: 17 U/L (ref 0–37)
Alkaline Phosphatase: 89 U/L (ref 39–117)
BILIRUBIN DIRECT: 0.1 mg/dL (ref 0.0–0.3)
BILIRUBIN TOTAL: 0.4 mg/dL (ref 0.2–1.2)
Indirect Bilirubin: 0.3 mg/dL (ref 0.2–1.2)
Total Protein: 6.7 g/dL (ref 6.0–8.3)

## 2014-12-03 LAB — LIPID PANEL
CHOLESTEROL: 165 mg/dL (ref 0–200)
HDL: 64 mg/dL (ref >=46)
LDL Cholesterol: 75 mg/dL (ref 0–99)
Total CHOL/HDL Ratio: 2.6 Ratio
Triglycerides: 131 mg/dL (ref <150)
VLDL: 26 mg/dL (ref 0–40)

## 2014-12-03 LAB — TSH: TSH: 0.831 u[IU]/mL (ref 0.350–4.500)

## 2014-12-03 LAB — BASIC METABOLIC PANEL
BUN: 14 mg/dL (ref 6–23)
CHLORIDE: 104 meq/L (ref 96–112)
CO2: 28 mEq/L (ref 19–32)
Calcium: 9.5 mg/dL (ref 8.4–10.5)
Creat: 0.85 mg/dL (ref 0.50–1.10)
Glucose, Bld: 87 mg/dL (ref 70–99)
Potassium: 4.6 mEq/L (ref 3.5–5.3)
SODIUM: 146 meq/L — AB (ref 135–145)

## 2014-12-03 MED ORDER — EPINEPHRINE 0.3 MG/0.3ML IJ SOAJ: 0.3000 mg | Freq: Once | INTRAMUSCULAR | Status: DC

## 2014-12-03 NOTE — Patient Instructions (Signed)
Preventive Care for Adults A healthy lifestyle and preventive care can promote health and wellness. Preventive health guidelines for women include the following key practices.  A routine yearly physical is a good way to check with your health care provider about your health and preventive screening. It is a chance to share any concerns and updates on your health and to receive a thorough exam.  Visit your dentist for a routine exam and preventive care every 6 months. Brush your teeth twice a day and floss once a day. Good oral hygiene prevents tooth decay and gum disease.  The frequency of eye exams is based on your age, health, family medical history, use of contact lenses, and other factors. Follow your health care provider's recommendations for frequency of eye exams.  Eat a healthy diet. Foods like vegetables, fruits, whole grains, low-fat dairy products, and lean protein foods contain the nutrients you need without too many calories. Decrease your intake of foods high in solid fats, added sugars, and salt. Eat the right amount of calories for you.Get information about a proper diet from your health care provider, if necessary.  Regular physical exercise is one of the most important things you can do for your health. Most adults should get at least 150 minutes of moderate-intensity exercise (any activity that increases your heart rate and causes you to sweat) each week. In addition, most adults need muscle-strengthening exercises on 2 or more days a week.  Maintain a healthy weight. The body mass index (BMI) is a screening tool to identify possible weight problems. It provides an estimate of body fat based on height and weight. Your health care provider can find your BMI and can help you achieve or maintain a healthy weight.For adults 20 years and older:  A BMI below 18.5 is considered underweight.  A BMI of 18.5 to 24.9 is normal.  A BMI of 25 to 29.9 is considered overweight.  A BMI of  30 and above is considered obese.  Maintain normal blood lipids and cholesterol levels by exercising and minimizing your intake of saturated fat. Eat a balanced diet with plenty of fruit and vegetables. Blood tests for lipids and cholesterol should begin at age 76 and be repeated every 5 years. If your lipid or cholesterol levels are high, you are over 50, or you are at high risk for heart disease, you may need your cholesterol levels checked more frequently.Ongoing high lipid and cholesterol levels should be treated with medicines if diet and exercise are not working.  If you smoke, find out from your health care provider how to quit. If you do not use tobacco, do not start.  Lung cancer screening is recommended for adults aged 22-80 years who are at high risk for developing lung cancer because of a history of smoking. A yearly low-dose CT scan of the lungs is recommended for people who have at least a 30-pack-year history of smoking and are a current smoker or have quit within the past 15 years. A pack year of smoking is smoking an average of 1 pack of cigarettes a day for 1 year (for example: 1 pack a day for 30 years or 2 packs a day for 15 years). Yearly screening should continue until the smoker has stopped smoking for at least 15 years. Yearly screening should be stopped for people who develop a health problem that would prevent them from having lung cancer treatment.  If you are pregnant, do not drink alcohol. If you are breastfeeding,  be very cautious about drinking alcohol. If you are not pregnant and choose to drink alcohol, do not have more than 1 drink per day. One drink is considered to be 12 ounces (355 mL) of beer, 5 ounces (148 mL) of wine, or 1.5 ounces (44 mL) of liquor.  Avoid use of street drugs. Do not share needles with anyone. Ask for help if you need support or instructions about stopping the use of drugs.  High blood pressure causes heart disease and increases the risk of  stroke. Your blood pressure should be checked at least every 1 to 2 years. Ongoing high blood pressure should be treated with medicines if weight loss and exercise do not work.  If you are 75-52 years old, ask your health care provider if you should take aspirin to prevent strokes.  Diabetes screening involves taking a blood sample to check your fasting blood sugar level. This should be done once every 3 years, after age 15, if you are within normal weight and without risk factors for diabetes. Testing should be considered at a younger age or be carried out more frequently if you are overweight and have at least 1 risk factor for diabetes.  Breast cancer screening is essential preventive care for women. You should practice "breast self-awareness." This means understanding the normal appearance and feel of your breasts and may include breast self-examination. Any changes detected, no matter how small, should be reported to a health care provider. Women in their 58s and 30s should have a clinical breast exam (CBE) by a health care provider as part of a regular health exam every 1 to 3 years. After age 16, women should have a CBE every year. Starting at age 53, women should consider having a mammogram (breast X-ray test) every year. Women who have a family history of breast cancer should talk to their health care provider about genetic screening. Women at a high risk of breast cancer should talk to their health care providers about having an MRI and a mammogram every year.  Breast cancer gene (BRCA)-related cancer risk assessment is recommended for women who have family members with BRCA-related cancers. BRCA-related cancers include breast, ovarian, tubal, and peritoneal cancers. Having family members with these cancers may be associated with an increased risk for harmful changes (mutations) in the breast cancer genes BRCA1 and BRCA2. Results of the assessment will determine the need for genetic counseling and  BRCA1 and BRCA2 testing.  Routine pelvic exams to screen for cancer are no longer recommended for nonpregnant women who are considered low risk for cancer of the pelvic organs (ovaries, uterus, and vagina) and who do not have symptoms. Ask your health care provider if a screening pelvic exam is right for you.  If you have had past treatment for cervical cancer or a condition that could lead to cancer, you need Pap tests and screening for cancer for at least 20 years after your treatment. If Pap tests have been discontinued, your risk factors (such as having a new sexual partner) need to be reassessed to determine if screening should be resumed. Some women have medical problems that increase the chance of getting cervical cancer. In these cases, your health care provider may recommend more frequent screening and Pap tests.  The HPV test is an additional test that may be used for cervical cancer screening. The HPV test looks for the virus that can cause the cell changes on the cervix. The cells collected during the Pap test can be  tested for HPV. The HPV test could be used to screen women aged 30 years and older, and should be used in women of any age who have unclear Pap test results. After the age of 30, women should have HPV testing at the same frequency as a Pap test.  Colorectal cancer can be detected and often prevented. Most routine colorectal cancer screening begins at the age of 50 years and continues through age 75 years. However, your health care provider may recommend screening at an earlier age if you have risk factors for colon cancer. On a yearly basis, your health care provider may provide home test kits to check for hidden blood in the stool. Use of a small camera at the end of a tube, to directly examine the colon (sigmoidoscopy or colonoscopy), can detect the earliest forms of colorectal cancer. Talk to your health care provider about this at age 50, when routine screening begins. Direct  exam of the colon should be repeated every 5-10 years through age 75 years, unless early forms of pre-cancerous polyps or small growths are found.  People who are at an increased risk for hepatitis B should be screened for this virus. You are considered at high risk for hepatitis B if:  You were born in a country where hepatitis B occurs often. Talk with your health care provider about which countries are considered high risk.  Your parents were born in a high-risk country and you have not received a shot to protect against hepatitis B (hepatitis B vaccine).  You have HIV or AIDS.  You use needles to inject street drugs.  You live with, or have sex with, someone who has hepatitis B.  You get hemodialysis treatment.  You take certain medicines for conditions like cancer, organ transplantation, and autoimmune conditions.  Hepatitis C blood testing is recommended for all people born from 1945 through 1965 and any individual with known risks for hepatitis C.  Practice safe sex. Use condoms and avoid high-risk sexual practices to reduce the spread of sexually transmitted infections (STIs). STIs include gonorrhea, chlamydia, syphilis, trichomonas, herpes, HPV, and human immunodeficiency virus (HIV). Herpes, HIV, and HPV are viral illnesses that have no cure. They can result in disability, cancer, and death.  You should be screened for sexually transmitted illnesses (STIs) including gonorrhea and chlamydia if:  You are sexually active and are younger than 24 years.  You are older than 24 years and your health care provider tells you that you are at risk for this type of infection.  Your sexual activity has changed since you were last screened and you are at an increased risk for chlamydia or gonorrhea. Ask your health care provider if you are at risk.  If you are at risk of being infected with HIV, it is recommended that you take a prescription medicine daily to prevent HIV infection. This is  called preexposure prophylaxis (PrEP). You are considered at risk if:  You are a heterosexual woman, are sexually active, and are at increased risk for HIV infection.  You take drugs by injection.  You are sexually active with a partner who has HIV.  Talk with your health care provider about whether you are at high risk of being infected with HIV. If you choose to begin PrEP, you should first be tested for HIV. You should then be tested every 3 months for as long as you are taking PrEP.  Osteoporosis is a disease in which the bones lose minerals and strength   with aging. This can result in serious bone fractures or breaks. The risk of osteoporosis can be identified using a bone density scan. Women ages 65 years and over and women at risk for fractures or osteoporosis should discuss screening with their health care providers. Ask your health care provider whether you should take a calcium supplement or vitamin D to reduce the rate of osteoporosis.  Menopause can be associated with physical symptoms and risks. Hormone replacement therapy is available to decrease symptoms and risks. You should talk to your health care provider about whether hormone replacement therapy is right for you.  Use sunscreen. Apply sunscreen liberally and repeatedly throughout the day. You should seek shade when your shadow is shorter than you. Protect yourself by wearing long sleeves, pants, a wide-brimmed hat, and sunglasses year round, whenever you are outdoors.  Once a month, do a whole body skin exam, using a mirror to look at the skin on your back. Tell your health care provider of new moles, moles that have irregular borders, moles that are larger than a pencil eraser, or moles that have changed in shape or color.  Stay current with required vaccines (immunizations).  Influenza vaccine. All adults should be immunized every year.  Tetanus, diphtheria, and acellular pertussis (Td, Tdap) vaccine. Pregnant women should  receive 1 dose of Tdap vaccine during each pregnancy. The dose should be obtained regardless of the length of time since the last dose. Immunization is preferred during the 27th-36th week of gestation. An adult who has not previously received Tdap or who does not know her vaccine status should receive 1 dose of Tdap. This initial dose should be followed by tetanus and diphtheria toxoids (Td) booster doses every 10 years. Adults with an unknown or incomplete history of completing a 3-dose immunization series with Td-containing vaccines should begin or complete a primary immunization series including a Tdap dose. Adults should receive a Td booster every 10 years.  Varicella vaccine. An adult without evidence of immunity to varicella should receive 2 doses or a second dose if she has previously received 1 dose. Pregnant females who do not have evidence of immunity should receive the first dose after pregnancy. This first dose should be obtained before leaving the health care facility. The second dose should be obtained 4-8 weeks after the first dose.  Human papillomavirus (HPV) vaccine. Females aged 13-26 years who have not received the vaccine previously should obtain the 3-dose series. The vaccine is not recommended for use in pregnant females. However, pregnancy testing is not needed before receiving a dose. If a female is found to be pregnant after receiving a dose, no treatment is needed. In that case, the remaining doses should be delayed until after the pregnancy. Immunization is recommended for any person with an immunocompromised condition through the age of 26 years if she did not get any or all doses earlier. During the 3-dose series, the second dose should be obtained 4-8 weeks after the first dose. The third dose should be obtained 24 weeks after the first dose and 16 weeks after the second dose.  Zoster vaccine. One dose is recommended for adults aged 60 years or older unless certain conditions are  present.  Measles, mumps, and rubella (MMR) vaccine. Adults born before 1957 generally are considered immune to measles and mumps. Adults born in 1957 or later should have 1 or more doses of MMR vaccine unless there is a contraindication to the vaccine or there is laboratory evidence of immunity to   each of the three diseases. A routine second dose of MMR vaccine should be obtained at least 28 days after the first dose for students attending postsecondary schools, health care workers, or international travelers. People who received inactivated measles vaccine or an unknown type of measles vaccine during 1963-1967 should receive 2 doses of MMR vaccine. People who received inactivated mumps vaccine or an unknown type of mumps vaccine before 1979 and are at high risk for mumps infection should consider immunization with 2 doses of MMR vaccine. For females of childbearing age, rubella immunity should be determined. If there is no evidence of immunity, females who are not pregnant should be vaccinated. If there is no evidence of immunity, females who are pregnant should delay immunization until after pregnancy. Unvaccinated health care workers born before 1957 who lack laboratory evidence of measles, mumps, or rubella immunity or laboratory confirmation of disease should consider measles and mumps immunization with 2 doses of MMR vaccine or rubella immunization with 1 dose of MMR vaccine.  Pneumococcal 13-valent conjugate (PCV13) vaccine. When indicated, a person who is uncertain of her immunization history and has no record of immunization should receive the PCV13 vaccine. An adult aged 19 years or older who has certain medical conditions and has not been previously immunized should receive 1 dose of PCV13 vaccine. This PCV13 should be followed with a dose of pneumococcal polysaccharide (PPSV23) vaccine. The PPSV23 vaccine dose should be obtained at least 8 weeks after the dose of PCV13 vaccine. An adult aged 19  years or older who has certain medical conditions and previously received 1 or more doses of PPSV23 vaccine should receive 1 dose of PCV13. The PCV13 vaccine dose should be obtained 1 or more years after the last PPSV23 vaccine dose.  Pneumococcal polysaccharide (PPSV23) vaccine. When PCV13 is also indicated, PCV13 should be obtained first. All adults aged 65 years and older should be immunized. An adult younger than age 65 years who has certain medical conditions should be immunized. Any person who resides in a nursing home or long-term care facility should be immunized. An adult smoker should be immunized. People with an immunocompromised condition and certain other conditions should receive both PCV13 and PPSV23 vaccines. People with human immunodeficiency virus (HIV) infection should be immunized as soon as possible after diagnosis. Immunization during chemotherapy or radiation therapy should be avoided. Routine use of PPSV23 vaccine is not recommended for American Indians, [place] Natives, or people younger than 65 years unless there are medical conditions that require PPSV23 vaccine. When indicated, people who have unknown immunization and have no record of immunization should receive PPSV23 vaccine. One-time revaccination 5 years after the first dose of PPSV23 is recommended for people aged 19-64 years who have chronic kidney failure, nephrotic syndrome, asplenia, or immunocompromised conditions. People who received 1-2 doses of PPSV23 before age 65 years should receive another dose of PPSV23 vaccine at age 65 years or later if at least 5 years have passed since the previous dose. Doses of PPSV23 are not needed for people immunized with PPSV23 at or after age 65 years.  Meningococcal vaccine. Adults with asplenia or persistent complement component deficiencies should receive 2 doses of quadrivalent meningococcal conjugate (MenACWY-D) vaccine. The doses should be obtained at least 2 months apart.  Microbiologists working with certain meningococcal bacteria, military recruits, people at risk during an outbreak, and people who travel to or live in countries with a high rate of meningitis should be immunized. A first-year college student up through age   21 years who is living in a residence hall should receive a dose if she did not receive a dose on or after her 16th birthday. Adults who have certain high-risk conditions should receive one or more doses of vaccine.  Hepatitis A vaccine. Adults who wish to be protected from this disease, have certain high-risk conditions, work with hepatitis A-infected animals, work in hepatitis A research labs, or travel to or work in countries with a high rate of hepatitis A should be immunized. Adults who were previously unvaccinated and who anticipate close contact with an international adoptee during the first 60 days after arrival in the [place] from a country with a high rate of hepatitis A should be immunized.  Hepatitis B vaccine. Adults who wish to be protected from this disease, have certain high-risk conditions, may be exposed to blood or other infectious body fluids, are household contacts or sex partners of hepatitis B positive people, are clients or workers in certain care facilities, or travel to or work in countries with a high rate of hepatitis B should be immunized.  Haemophilus influenzae type b (Hib) vaccine. A previously unvaccinated person with asplenia or sickle cell disease or having a scheduled splenectomy should receive 1 dose of Hib vaccine. Regardless of previous immunization, a recipient of a hematopoietic stem cell transplant should receive a 3-dose series 6-12 months after her successful transplant. Hib vaccine is not recommended for adults with HIV infection. Preventive Services / Frequency Ages 64 to 68 years  Blood pressure check.** / Every 1 to 2 years.  Lipid and cholesterol check.** / Every 5 years beginning at age  22.  Clinical breast exam.** / Every 3 years for women in their 88s and 53s.  BRCA-related cancer risk assessment.** / For women who have family members with a BRCA-related cancer (breast, ovarian, tubal, or peritoneal cancers).  Pap test.** / Every 2 years from ages 90 through 51. Every 3 years starting at age 21 through age 56 or 3 with a history of 3 consecutive normal Pap tests.  HPV screening.** / Every 3 years from ages 24 through ages 1 to 46 with a history of 3 consecutive normal Pap tests.  Hepatitis C blood test.** / For any individual with known risks for hepatitis C.  Skin self-exam. / Monthly.  Influenza vaccine. / Every year.  Tetanus, diphtheria, and acellular pertussis (Tdap, Td) vaccine.** / Consult your health care provider. Pregnant women should receive 1 dose of Tdap vaccine during each pregnancy. 1 dose of Td every 10 years.  Varicella vaccine.** / Consult your health care provider. Pregnant females who do not have evidence of immunity should receive the first dose after pregnancy.  HPV vaccine. / 3 doses over 6 months, if 72 and younger. The vaccine is not recommended for use in pregnant females. However, pregnancy testing is not needed before receiving a dose.  Measles, mumps, rubella (MMR) vaccine.** / You need at least 1 dose of MMR if you were born in 1957 or later. You may also need a 2nd dose. For females of childbearing age, rubella immunity should be determined. If there is no evidence of immunity, females who are not pregnant should be vaccinated. If there is no evidence of immunity, females who are pregnant should delay immunization until after pregnancy.  Pneumococcal 13-valent conjugate (PCV13) vaccine.** / Consult your health care provider.  Pneumococcal polysaccharide (PPSV23) vaccine.** / 1 to 2 doses if you smoke cigarettes or if you have certain conditions.  Meningococcal vaccine.** /  1 dose if you are age 19 to 21 years and a first-year college  student living in a residence hall, or have one of several medical conditions, you need to get vaccinated against meningococcal disease. You may also need additional booster doses.  Hepatitis A vaccine.** / Consult your health care provider.  Hepatitis B vaccine.** / Consult your health care provider.  Haemophilus influenzae type b (Hib) vaccine.** / Consult your health care provider. Ages 40 to 64 years  Blood pressure check.** / Every 1 to 2 years.  Lipid and cholesterol check.** / Every 5 years beginning at age 20 years.  Lung cancer screening. / Every year if you are aged 55-80 years and have a 30-pack-year history of smoking and currently smoke or have quit within the past 15 years. Yearly screening is stopped once you have quit smoking for at least 15 years or develop a health problem that would prevent you from having lung cancer treatment.  Clinical breast exam.** / Every year after age 40 years.  BRCA-related cancer risk assessment.** / For women who have family members with a BRCA-related cancer (breast, ovarian, tubal, or peritoneal cancers).  Mammogram.** / Every year beginning at age 40 years and continuing for as long as you are in good health. Consult with your health care provider.  Pap test.** / Every 3 years starting at age 30 years through age 65 or 70 years with a history of 3 consecutive normal Pap tests.  HPV screening.** / Every 3 years from ages 30 years through ages 65 to 70 years with a history of 3 consecutive normal Pap tests.  Fecal occult blood test (FOBT) of stool. / Every year beginning at age 50 years and continuing until age 75 years. You may not need to do this test if you get a colonoscopy every 10 years.  Flexible sigmoidoscopy or colonoscopy.** / Every 5 years for a flexible sigmoidoscopy or every 10 years for a colonoscopy beginning at age 50 years and continuing until age 75 years.  Hepatitis C blood test.** / For all people born from 1945 through  1965 and any individual with known risks for hepatitis C.  Skin self-exam. / Monthly.  Influenza vaccine. / Every year.  Tetanus, diphtheria, and acellular pertussis (Tdap/Td) vaccine.** / Consult your health care provider. Pregnant women should receive 1 dose of Tdap vaccine during each pregnancy. 1 dose of Td every 10 years.  Varicella vaccine.** / Consult your health care provider. Pregnant females who do not have evidence of immunity should receive the first dose after pregnancy.  Zoster vaccine.** / 1 dose for adults aged 60 years or older.  Measles, mumps, rubella (MMR) vaccine.** / You need at least 1 dose of MMR if you were born in 1957 or later. You may also need a 2nd dose. For females of childbearing age, rubella immunity should be determined. If there is no evidence of immunity, females who are not pregnant should be vaccinated. If there is no evidence of immunity, females who are pregnant should delay immunization until after pregnancy.  Pneumococcal 13-valent conjugate (PCV13) vaccine.** / Consult your health care provider.  Pneumococcal polysaccharide (PPSV23) vaccine.** / 1 to 2 doses if you smoke cigarettes or if you have certain conditions.  Meningococcal vaccine.** / Consult your health care provider.  Hepatitis A vaccine.** / Consult your health care provider.  Hepatitis B vaccine.** / Consult your health care provider.  Haemophilus influenzae type b (Hib) vaccine.** / Consult your health care provider. Ages 65   years and over  Blood pressure check.** / Every 1 to 2 years.  Lipid and cholesterol check.** / Every 5 years beginning at age 22 years.  Lung cancer screening. / Every year if you are aged 73-80 years and have a 30-pack-year history of smoking and currently smoke or have quit within the past 15 years. Yearly screening is stopped once you have quit smoking for at least 15 years or develop a health problem that would prevent you from having lung cancer  treatment.  Clinical breast exam.** / Every year after age 4 years.  BRCA-related cancer risk assessment.** / For women who have family members with a BRCA-related cancer (breast, ovarian, tubal, or peritoneal cancers).  Mammogram.** / Every year beginning at age 40 years and continuing for as long as you are in good health. Consult with your health care provider.  Pap test.** / Every 3 years starting at age 9 years through age 34 or 91 years with 3 consecutive normal Pap tests. Testing can be stopped between 65 and 70 years with 3 consecutive normal Pap tests and no abnormal Pap or HPV tests in the past 10 years.  HPV screening.** / Every 3 years from ages 57 years through ages 64 or 45 years with a history of 3 consecutive normal Pap tests. Testing can be stopped between 65 and 70 years with 3 consecutive normal Pap tests and no abnormal Pap or HPV tests in the past 10 years.  Fecal occult blood test (FOBT) of stool. / Every year beginning at age 15 years and continuing until age 17 years. You may not need to do this test if you get a colonoscopy every 10 years.  Flexible sigmoidoscopy or colonoscopy.** / Every 5 years for a flexible sigmoidoscopy or every 10 years for a colonoscopy beginning at age 86 years and continuing until age 71 years.  Hepatitis C blood test.** / For all people born from [birth year] through 1963 and any individual with known risks for hepatitis C.  Osteoporosis screening.** / A one-time screening for women ages 83 years and over and women at risk for fractures or osteoporosis.  Skin self-exam. / Monthly.  Influenza vaccine. / Every year.  Tetanus, diphtheria, and acellular pertussis (Tdap/Td) vaccine.** / 1 dose of Td every 10 years.  Varicella vaccine.** / Consult your health care provider.  Zoster vaccine.** / 1 dose for adults aged 61 years or older.  Pneumococcal 13-valent conjugate (PCV13) vaccine.** / Consult your health care provider.  Pneumococcal  polysaccharide (PPSV23) vaccine.** / 1 dose for all adults aged 28 years and older.  Meningococcal vaccine.** / Consult your health care provider.  Hepatitis A vaccine.** / Consult your health care provider.  Hepatitis B vaccine.** / Consult your health care provider.  Haemophilus influenzae type b (Hib) vaccine.** / Consult your health care provider. ** Family history and personal history of risk and conditions may change your health care provider's recommendations. Document Released: 07/17/2001 Document Revised: 10/05/2013 Document Reviewed: 10/16/2010 [facility] Patient Information 2015 [place], [place]. This information is not intended to replace advice given to you by your health care provider. Make sure you discuss any questions you have with your health care provider.

## 2014-12-03 NOTE — Progress Notes (Signed)
Pre visit review using our clinic review tool, if applicable. No additional management support is needed unless otherwise documented below in the visit note. 

## 2014-12-03 NOTE — Progress Notes (Signed)
Subjective:   Carla Reilly is a 80 y.o. female who presents for Medicare Annual (Subsequent) preventive examination. She also needs refills on meds and labs. Review of Systems:   Review of Systems  Constitutional: Negative for activity change, appetite change and fatigue.  HENT: Negative for hearing loss, congestion, tinnitus and ear discharge.   Eyes: Negative for visual disturbance (see optho q1y -- vision corrected to 20/20 with glasses).  Respiratory: Negative for cough, chest tightness and shortness of breath.   Cardiovascular: Negative for chest pain, palpitations and leg swelling.  Gastrointestinal: Negative for abdominal pain, diarrhea, constipation and abdominal distention.  Genitourinary: Negative for urgency, frequency, decreased urine volume and difficulty urinating.  Musculoskeletal: Negative for back pain, arthralgias and gait problem.  Skin: Negative for color change, pallor and rash.  Neurological: Negative for dizziness, light-headedness, numbness and headaches.  Hematological: Negative for adenopathy. Does not bruise/bleed easily.  Psychiatric/Behavioral: Negative for suicidal ideas, confusion, sleep disturbance, self-injury, dysphoric mood, decreased concentration and agitation.  Pt is able to read and write and can do all ADLs No risk for falling No abuse/ violence in home           Objective:     Vitals: BP 126/76 mmHg  Pulse 76  Temp(Src) 98.8 F (37.1 C) (Oral)  Ht 5\' 5"  (1.651 m)  Wt 200 lb 12.8 oz (91.082 kg)  BMI 33.41 kg/m2  SpO2 98% BP 126/76 mmHg  Pulse 76  Temp(Src) 98.8 F (37.1 C) (Oral)  Ht 5\' 5"  (1.651 m)  Wt 200 lb 12.8 oz (91.082 kg)  BMI 33.41 kg/m2  SpO2 98% General appearance: alert, cooperative, appears stated age and no distress Head: Normocephalic, without obvious abnormality, atraumatic Eyes: conjunctivae/corneas clear. PERRL, EOM's intact. Fundi benign. Ears: normal TM's and external ear canals both ears Nose: Nares  normal. Septum midline. Mucosa normal. No drainage or sinus tenderness. Throat: lips, mucosa, and tongue normal; teeth and gums normal Neck: no adenopathy, no carotid bruit, no JVD, supple, symmetrical, trachea midline and thyroid not enlarged, symmetric, no tenderness/mass/nodules Back: symmetric, no curvature. ROM normal. No CVA tenderness. Lungs: clear to auscultation bilaterally Breasts: normal appearance, no masses or tenderness Heart: regular rate and rhythm, S1, S2 normal, no murmur, click, rub or gallop Abdomen: soft, non-tender; bowel sounds normal; no masses,  no organomegaly Pelvic: deferred--gyn  Extremities: extremities normal, atraumatic, no cyanosis or edema Pulses: 2+ and symmetric Skin: Skin color, texture, turgor normal. No rashes or lesions Lymph nodes: Cervical, supraclavicular, and axillary nodes normal. Neurologic: Alert and oriented X 3, normal strength and tone. Normal symmetric reflexes. Normal coordination and gait Psych- no depression, no anxiety Tobacco History  Smoking status  . Former Smoker  . Quit date: 06/04/1972  Smokeless tobacco  . Not on file     Counseling given: Not Answered   Past Medical History  Diagnosis Date  . Hyperlipidemia   . Thyroid disease     Hypothyroidism   Past Surgical History  Procedure Laterality Date  . Tonsillectomy    . Dilation and curettage of uterus  1980   Family History  Problem Relation Age of Onset  . Prostate cancer Father   . Cancer Father     prostate  . Hypothyroidism Mother   . Dementia Mother     died at 87yo  . Hypertension Brother    History  Sexual Activity  . Sexual Activity:  . Partners: Male    Outpatient Encounter Prescriptions as of 12/03/2014  Medication Sig  .  Calcium Carbonate-Vitamin D (CALCIUM 600 + D PO) Take by mouth 2 (two) times daily.   . CHROMIUM PO Take 800 mcg by mouth daily.   . Coenzyme Q10 (VITALINE COQ10 PO) Take 600 mg by mouth.  . EPINEPHrine 0.3 mg/0.3 mL IJ  SOAJ injection Inject 0.3 mLs (0.3 mg total) into the muscle once.  . fish oil-omega-3 fatty acids 1000 MG capsule Take 1 g by mouth 2 (two) times daily.    Marland Kitchen GLUCOSAMINE PO Take 1,000 mg by mouth daily.   Marland Kitchen levothyroxine (SYNTHROID, LEVOTHROID) 100 MCG tablet Take 1 tablet by mouth  daily  . Lutein 40 MG CAPS Take 1 capsule by mouth daily.  . Multiple Vitamin (MULTIVITAMIN) tablet Take 1 tablet by mouth daily.    . simvastatin (ZOCOR) 40 MG tablet Take 1 tablet (40 mg total) by mouth at bedtime. Repeat labs are due now  . Specialty Vitamins Products (RA EAR CARE) TABS Take 4 tablets by mouth daily.  . vitamin B-12 (CYANOCOBALAMIN) 1000 MCG tablet Take 1,000 mcg by mouth daily.    . [DISCONTINUED] EPINEPHrine 0.3 mg/0.3 mL IJ SOAJ injection Inject 0.3 mLs (0.3 mg total) into the muscle once.  . [DISCONTINUED] metroNIDAZOLE (METROGEL VAGINAL) 0.75 % vaginal gel Place 1 Applicatorful vaginally 2 (two) times daily.  . [DISCONTINUED] NON FORMULARY Place 1 suppository vaginally daily. [place]   No facility-administered encounter medications on file as of 12/03/2014.    Activities of Daily Living In your present state of health, do you have any difficulty performing the following activities: 12/03/2014 12/03/2014  Hearing? N N  Vision? N N  Difficulty concentrating or making decisions? N N  Walking or climbing stairs? N N  Dressing or bathing? N N  Doing errands, shopping? N N    Patient Care Team: Rosalita Chessman, DO as PCP - [place], OD (Optometry) Viona Gilmore Evette Cristal, MD as Consulting Physician (Obstetrics and Gynecology)    Assessment:    See AVS Exercise Activities and Dietary recommendations--gym and walks a lot    Goals    None     Fall Risk Fall Risk  12/03/2014 12/03/2014 10/16/2013  Falls in the past year? No No No   Depression Screen PHQ 2/9 Scores 12/03/2014 12/03/2014 10/16/2013  PHQ - 2 Score 0 0 0     Cognitive Testing AAOx3  nad  Immunization History    Administered Date(s) Administered  . Influenza Split 03/20/2011, 04/03/2012  . Influenza Whole 04/04/2006, 04/25/2007, 03/05/2008, 03/18/2009, 03/10/2010  . Influenza,inj,Quad PF,36+ Mos 03/31/2013, 03/04/2014  . Pneumococcal Conjugate-13 12/03/2014  . Pneumococcal Polysaccharide-23 06/05/2003  . Td 06/04/2001  . Zoster 10/16/2010   Screening Tests Health Maintenance  Topic Date Due  . PNA vac Low Risk Adult (2 of 2 - PCV13) 06/04/2004  . INFLUENZA VACCINE  01/03/2015  . COLONOSCOPY  09/14/2020  . TETANUS/TDAP  10/29/2023  . DEXA SCAN  Completed  . ZOSTAVAX  Completed      Plan:    see AVS During the course of the visit the patient was educated and counseled about the following appropriate screening and preventive services:   Vaccines to include Pneumoccal, Influenza, Hepatitis B, Td, Zostavax, HCV  Electrocardiogram  Cardiovascular Disease  Colorectal cancer screening  Bone density screening  Diabetes screening  Glaucoma screening  Mammography/PAP  Nutrition counseling   Patient Instructions (the written plan) was given to the patient.  1. Need for pneumococcal vaccination   - Pneumococcal conjugate vaccine 13-valent  2. Hypothyroidism,  unspecified hypothyroidism type   - TSH  3. Hyperlipidemia   - Basic metabolic panel - CBC with Differential/Platelet - Hepatic function panel - Lipid panel - POCT urinalysis dipstick - TSH  4. Estrogen deficiency   - DG Bone Density; Future  5. Breast cancer screening   - MM DIGITAL SCREENING BILATERAL; Future  6. Preventative health care    7. Routine history and physical examination of adult    Garnet Koyanagi, DO  12/03/2014

## 2014-12-17 ENCOUNTER — Ambulatory Visit (HOSPITAL_BASED_OUTPATIENT_CLINIC_OR_DEPARTMENT_OTHER): Payer: Medicare Other

## 2014-12-20 ENCOUNTER — Other Ambulatory Visit (HOSPITAL_BASED_OUTPATIENT_CLINIC_OR_DEPARTMENT_OTHER): Payer: Medicare Other

## 2014-12-24 ENCOUNTER — Ambulatory Visit (HOSPITAL_BASED_OUTPATIENT_CLINIC_OR_DEPARTMENT_OTHER)
Admission: RE | Admit: 2014-12-24 | Discharge: 2014-12-24 | Disposition: A | Discharge status: Home / Self Care | Payer: Medicare Other | Source: Ambulatory Visit | Attending: Family Medicine | Admitting: Family Medicine

## 2014-12-24 DIAGNOSIS — Z1239 Encounter for other screening for malignant neoplasm of breast: Secondary | ICD-10-CM

## 2014-12-24 DIAGNOSIS — Z780 Asymptomatic menopausal state: Secondary | ICD-10-CM | POA: Diagnosis not present

## 2014-12-24 DIAGNOSIS — M8580 Other specified disorders of bone density and structure, unspecified site: Secondary | ICD-10-CM | POA: Diagnosis not present

## 2014-12-24 DIAGNOSIS — M8589 Other specified disorders of bone density and structure, multiple sites: Secondary | ICD-10-CM | POA: Diagnosis not present

## 2014-12-24 DIAGNOSIS — E2839 Other primary ovarian failure: Secondary | ICD-10-CM

## 2014-12-24 DIAGNOSIS — Z1231 Encounter for screening mammogram for malignant neoplasm of breast: Secondary | ICD-10-CM | POA: Insufficient documentation

## 2015-01-05 ENCOUNTER — Telehealth: Payer: Self-pay | Admitting: Family Medicine

## 2015-01-05 NOTE — Telephone Encounter (Signed)
Relation to CQ:FJUV  Call back number:[phone]   Reason for call:  Patient returning your call regarding bone density results.

## 2015-01-06 NOTE — Telephone Encounter (Addendum)
Notes Recorded by Rosalita Chessman, DO on 12/31/2014 at 7:11 PM + osteopenia--- Pt is taking ca and vita d---- take fosamax 70 weekly #4 11 refills Recheck 2 year    Spoke with patient and she declined Fosamax and she said she is still having issues from the previous Rx of Fosamax, she said she will not take anything at this time for Osteopenia. I made MD aware.      KP

## 2015-01-13 ENCOUNTER — Other Ambulatory Visit: Payer: Self-pay | Admitting: Family Medicine

## 2015-02-24 ENCOUNTER — Telehealth: Payer: Self-pay | Admitting: Family Medicine

## 2015-02-24 NOTE — Telephone Encounter (Signed)
Does pt need to see Dr. Nori Riis or can Dr. Etter Sjogren prescribe estrogen? Pt is having bloody show and this happened back in December and Dr. Nori Riis said estrogren may be beneficial.

## 2015-02-24 NOTE — Telephone Encounter (Signed)
Spoke with patient and she has been made aware to follow up with the specialist,s he verbalized understanding and will call him tomorrow.      KP

## 2015-02-28 DIAGNOSIS — N950 Postmenopausal bleeding: Secondary | ICD-10-CM | POA: Diagnosis not present

## 2015-02-28 DIAGNOSIS — N840 Polyp of corpus uteri: Secondary | ICD-10-CM | POA: Diagnosis not present

## 2015-02-28 DIAGNOSIS — Z124 Encounter for screening for malignant neoplasm of cervix: Secondary | ICD-10-CM | POA: Diagnosis not present

## 2015-03-10 DIAGNOSIS — H25813 Combined forms of age-related cataract, bilateral: Secondary | ICD-10-CM | POA: Diagnosis not present

## 2015-03-10 NOTE — H&P (Signed)
NAMEJENNAVECIA, SCHWIER               ACCOUNT NO.:  [id number]  MEDICAL RECORD NO.:  [id number]  LOCATION:                               FACILITY:  [facility]  PHYSICIAN:  Maisie Fus, M.D.   DATE OF BIRTH:  03/16/1935  DATE OF ADMISSION:  03/16/2015 DATE OF DISCHARGE:                             HISTORY & PHYSICAL   Her admission date for outpatient surgery is March 16, 2015.  ADMITTING DIAGNOSES:  Postmenopausal bleeding.  Ultrasound evidence of endometrial polyps.  HISTORY OF PRESENT ILLNESS:  The patient is a 80 year old, who had an episode of postmenopausal bleeding and was seen in the office and found to have marked thickening of the endometrial cavity consistent with endometrial polyps.  The endometrium was 1.57 cm.  She is admitted now for hysteroscopy, D and C, resection of polyps.  The patient is a 80 year old with no other significant medical illnesses except for thyroid disorder.  PAST MEDICAL HISTORY:  Hypothyroidism.  No other medical conditions except hyperlipidemia.  MEDICATIONS:  She only takes 2 medications, Synthroid 100 mcg a day and Crestor 40 mg a day.  ALLERGIES:  She has no known drug allergies.  SOCIAL HISTORY:  She is not a cigarette smoker.  Does not use alcohol.  PHYSICAL EXAMINATION:  HEENT:  Grossly normal. VITAL SIGNS:  Blood pressure in the office 128/80. CHEST:  Clear to auscultation. HEART:  Normal sinus rhythm without murmurs, rubs, or gallops. ABDOMEN:  Soft, nontender.  No appreciable organomegaly. PELVIC:  External genitalia, vagina, and cervix were normal for age and parity.  Uterus is not palpably enlarged.  No palpable adnexal masses. Rectovaginal exam confirms these findings.  ASSESSMENT:  A 80 year old with postmenopausal bleeding and endometrial thickening.  PLAN:  Hysteroscopy, D and C, perioperative PAS hose and IV antibiotic.     Maisie Fus, M.D.     WRN/MEDQ  D:  03/09/2015  T:  03/10/2015  Job:  [id number]

## 2015-03-11 ENCOUNTER — Encounter (HOSPITAL_BASED_OUTPATIENT_CLINIC_OR_DEPARTMENT_OTHER): Payer: Self-pay | Admitting: *Deleted

## 2015-03-11 NOTE — Progress Notes (Signed)
Pt instructed npo pmn x synthroid w sip of water.  To [facility] 10/12 @ 0700.  To [facility] for cbc prior to DOS.  Needs ekg on arrival.

## 2015-03-14 DIAGNOSIS — Z79899 Other long term (current) drug therapy: Secondary | ICD-10-CM | POA: Diagnosis not present

## 2015-03-14 DIAGNOSIS — N950 Postmenopausal bleeding: Secondary | ICD-10-CM | POA: Diagnosis not present

## 2015-03-14 DIAGNOSIS — E785 Hyperlipidemia, unspecified: Secondary | ICD-10-CM | POA: Diagnosis not present

## 2015-03-14 DIAGNOSIS — E039 Hypothyroidism, unspecified: Secondary | ICD-10-CM | POA: Diagnosis not present

## 2015-03-14 DIAGNOSIS — Z87891 Personal history of nicotine dependence: Secondary | ICD-10-CM | POA: Diagnosis not present

## 2015-03-14 DIAGNOSIS — N840 Polyp of corpus uteri: Secondary | ICD-10-CM | POA: Diagnosis not present

## 2015-03-14 LAB — CBC
HEMATOCRIT: 42.9 % (ref 36.0–46.0)
HEMOGLOBIN: 14.3 g/dL (ref 12.0–15.0)
MCH: 31.6 pg (ref 26.0–34.0)
MCHC: 33.3 g/dL (ref 30.0–36.0)
MCV: 94.7 fL (ref 78.0–100.0)
Platelets: 217 10*3/uL (ref 150–400)
RBC: 4.53 MIL/uL (ref 3.87–5.11)
RDW: 13.2 % (ref 11.5–15.5)
WBC: 6.5 10*3/uL (ref 4.0–10.5)

## 2015-03-16 ENCOUNTER — Encounter (HOSPITAL_BASED_OUTPATIENT_CLINIC_OR_DEPARTMENT_OTHER): Payer: Self-pay | Admitting: *Deleted

## 2015-03-16 ENCOUNTER — Ambulatory Visit (HOSPITAL_BASED_OUTPATIENT_CLINIC_OR_DEPARTMENT_OTHER)
Admission: RE | Admit: 2015-03-16 | Discharge: 2015-03-16 | Disposition: A | Discharge status: Home / Self Care | Payer: Medicare Other | Source: Ambulatory Visit | Attending: Obstetrics & Gynecology | Admitting: Obstetrics & Gynecology

## 2015-03-16 ENCOUNTER — Ambulatory Visit (HOSPITAL_BASED_OUTPATIENT_CLINIC_OR_DEPARTMENT_OTHER): Payer: Medicare Other | Admitting: Anesthesiology

## 2015-03-16 ENCOUNTER — Encounter (HOSPITAL_BASED_OUTPATIENT_CLINIC_OR_DEPARTMENT_OTHER)
Admission: RE | Disposition: A | Discharge status: Home / Self Care | Payer: Self-pay | Source: Ambulatory Visit | Attending: Obstetrics & Gynecology

## 2015-03-16 DIAGNOSIS — Z87891 Personal history of nicotine dependence: Secondary | ICD-10-CM | POA: Diagnosis not present

## 2015-03-16 DIAGNOSIS — N950 Postmenopausal bleeding: Secondary | ICD-10-CM | POA: Diagnosis present

## 2015-03-16 DIAGNOSIS — E785 Hyperlipidemia, unspecified: Secondary | ICD-10-CM | POA: Diagnosis not present

## 2015-03-16 DIAGNOSIS — Z79899 Other long term (current) drug therapy: Secondary | ICD-10-CM | POA: Diagnosis not present

## 2015-03-16 DIAGNOSIS — N840 Polyp of corpus uteri: Secondary | ICD-10-CM | POA: Insufficient documentation

## 2015-03-16 DIAGNOSIS — E039 Hypothyroidism, unspecified: Secondary | ICD-10-CM | POA: Insufficient documentation

## 2015-03-16 HISTORY — DX: Presence of spectacles and contact lenses: Z97.3

## 2015-03-16 HISTORY — PX: HYSTEROSCOPY WITH D & C: SHX1775

## 2015-03-16 HISTORY — DX: Carpal tunnel syndrome, unspecified upper limb: G56.00

## 2015-03-16 HISTORY — DX: Unspecified cataract: H26.9

## 2015-03-16 HISTORY — DX: Presence of dental prosthetic device (complete) (partial): Z97.2

## 2015-03-16 SURGERY — DILATATION AND CURETTAGE /HYSTEROSCOPY
Anesthesia: General

## 2015-03-16 MED ORDER — CEFAZOLIN SODIUM-DEXTROSE 2-3 GM-% IV SOLR
2.0000 g | INTRAVENOUS | Status: AC
  Administered 2015-03-16: 2 g via INTRAVENOUS
  Filled 2015-03-16: qty 50

## 2015-03-16 MED ORDER — LACTATED RINGERS IV SOLN
INTRAVENOUS | Status: DC
  Administered 2015-03-16: 08:00:00 via INTRAVENOUS
  Filled 2015-03-16: qty 1000

## 2015-03-16 MED ORDER — FENTANYL CITRATE (PF) 100 MCG/2ML IJ SOLN
25.0000 ug | INTRAMUSCULAR | Status: DC | PRN
  Filled 2015-03-16: qty 1

## 2015-03-16 MED ORDER — PROPOFOL 10 MG/ML IV BOLUS
INTRAVENOUS | Status: DC | PRN
  Administered 2015-03-16: 150 mg via INTRAVENOUS

## 2015-03-16 MED ORDER — PROMETHAZINE HCL 25 MG/ML IJ SOLN
6.2500 mg | INTRAMUSCULAR | Status: DC | PRN
  Filled 2015-03-16: qty 1

## 2015-03-16 MED ORDER — LACTATED RINGERS IV SOLN
INTRAVENOUS | Status: DC
Start: 2015-03-16 — End: 2015-03-16
  Filled 2015-03-16: qty 1000

## 2015-03-16 MED ORDER — ONDANSETRON HCL 4 MG/2ML IJ SOLN
INTRAMUSCULAR | Status: DC | PRN
  Administered 2015-03-16: 4 mg via INTRAVENOUS

## 2015-03-16 MED ORDER — CEFAZOLIN SODIUM-DEXTROSE 2-3 GM-% IV SOLR
INTRAVENOUS | Status: AC
  Filled 2015-03-16: qty 50

## 2015-03-16 MED ORDER — MIDAZOLAM HCL 2 MG/2ML IJ SOLN
INTRAMUSCULAR | Status: AC
  Filled 2015-03-16: qty 2

## 2015-03-16 MED ORDER — GLYCINE 1.5 % IR SOLN
Status: DC | PRN
  Administered 2015-03-16: 3000 mL

## 2015-03-16 MED ORDER — FENTANYL CITRATE (PF) 100 MCG/2ML IJ SOLN
INTRAMUSCULAR | Status: DC | PRN
  Administered 2015-03-16: 50 ug via INTRAVENOUS

## 2015-03-16 MED ORDER — KETOROLAC TROMETHAMINE 30 MG/ML IJ SOLN
INTRAMUSCULAR | Status: DC | PRN
  Administered 2015-03-16: 30 mg via INTRAVENOUS

## 2015-03-16 MED ORDER — DEXAMETHASONE SODIUM PHOSPHATE 4 MG/ML IJ SOLN
INTRAMUSCULAR | Status: DC | PRN
Start: 2015-03-16 — End: 2015-03-16
  Administered 2015-03-16: 5 mg via INTRAVENOUS

## 2015-03-16 MED ORDER — LIDOCAINE HCL 1 % IJ SOLN
INTRAMUSCULAR | Status: DC | PRN
  Administered 2015-03-16: 10 mL

## 2015-03-16 MED ORDER — FENTANYL CITRATE (PF) 100 MCG/2ML IJ SOLN
INTRAMUSCULAR | Status: AC
  Filled 2015-03-16: qty 2

## 2015-03-16 MED ORDER — LIDOCAINE HCL (CARDIAC) 20 MG/ML IV SOLN
INTRAVENOUS | Status: DC | PRN
  Administered 2015-03-16: 50 mg via INTRAVENOUS

## 2015-03-16 SURGICAL SUPPLY — 27 items
ABLATOR ENDOMETRIAL BIPOLAR (ABLATOR) IMPLANT
CANISTER SUCTION 2500CC (MISCELLANEOUS) ×3 IMPLANT
CATH ROBINSON RED A/P 16FR (CATHETERS) ×3 IMPLANT
CLOTH BEACON ORANGE TIMEOUT ST (SAFETY) ×3 IMPLANT
COVER BACK TABLE 60X90IN (DRAPES) ×3 IMPLANT
DRAPE LG THREE QUARTER DISP (DRAPES) ×3 IMPLANT
ELECT REM PT RETURN 9FT ADLT (ELECTROSURGICAL) ×3
ELECTRODE REM PT RTRN 9FT ADLT (ELECTROSURGICAL) IMPLANT
GLOVE BIO SURGEON STRL SZ 6.5 (GLOVE) ×1 IMPLANT
GLOVE BIO SURGEON STRL SZ7.5 (GLOVE) ×3 IMPLANT
GLOVE BIO SURGEONS STRL SZ 6.5 (GLOVE) ×1
GLOVE BIOGEL PI IND STRL 6.5 (GLOVE) IMPLANT
GLOVE BIOGEL PI IND STRL 7.5 (GLOVE) IMPLANT
GLOVE BIOGEL PI INDICATOR 6.5 (GLOVE) ×2
GLOVE BIOGEL PI INDICATOR 7.5 (GLOVE) ×2
GLYCINE 1.5% IRRIG UROMATIC (IV SOLUTION) ×2 IMPLANT
GOWN STRL REUS W/ TWL LRG LVL3 (GOWN DISPOSABLE) ×1 IMPLANT
GOWN STRL REUS W/ TWL XL LVL3 (GOWN DISPOSABLE) ×2 IMPLANT
GOWN STRL REUS W/TWL LRG LVL3 (GOWN DISPOSABLE) ×3
GOWN STRL REUS W/TWL XL LVL3 (GOWN DISPOSABLE) ×6
KIT ROOM TURNOVER WOR (KITS) ×3 IMPLANT
LEGGING LITHOTOMY PAIR STRL (DRAPES) ×3 IMPLANT
LOOP ANGLED CUTTING 22FR (CUTTING LOOP) IMPLANT
NS IRRIG 500ML POUR BTL (IV SOLUTION) ×2 IMPLANT
PAD OB MATERNITY 4.3X12.25 (PERSONAL CARE ITEMS) ×3 IMPLANT
TOWEL OR 17X24 6PK STRL BLUE (TOWEL DISPOSABLE) ×6 IMPLANT
TRAY DSU PREP LF (CUSTOM PROCEDURE TRAY) ×3 IMPLANT

## 2015-03-16 NOTE — Anesthesia Postprocedure Evaluation (Signed)
  Anesthesia Post-op Note  Patient: [place]  Procedure(s) Performed: Procedure(s): DILATATION AND CURETTAGE /HYSTEROSCOPY (N/A)  Patient Location: PACU  Anesthesia Type:General  Level of Consciousness: awake, alert  and oriented  Airway and Oxygen Therapy: Patient Spontanous Breathing  Post-op Pain: none  Post-op Assessment: Post-op Vital signs reviewed and Patient's Cardiovascular Status Stable              Post-op Vital Signs: Reviewed and stable  Last Vitals:  Filed Vitals:   03/16/15 1005  BP: 158/70  Pulse: 79  Temp: 36.7 C  Resp: 14    Complications: No apparent anesthesia complications

## 2015-03-16 NOTE — Anesthesia Preprocedure Evaluation (Addendum)
Anesthesia Evaluation  Patient identified by MRN, date of birth, ID band Patient awake    Reviewed: Allergy & Precautions, NPO status , Patient's Chart, lab work & pertinent test results  Airway Mallampati: I  TM Distance: >3 FB Neck ROM: Full    Dental  (+) Upper Dentures, Partial Lower   Pulmonary former smoker,    breath sounds clear to auscultation       Cardiovascular negative cardio ROS   Rhythm:Regular Rate:Normal     Neuro/Psych negative psych ROS   GI/Hepatic negative GI ROS, Neg liver ROS,   Endo/Other  Hypothyroidism   Renal/GU negative Renal ROS  negative genitourinary   Musculoskeletal negative musculoskeletal ROS (+)   Abdominal   Peds negative pediatric ROS (+)  Hematology negative hematology ROS (+)   Anesthesia Other Findings   Reproductive/Obstetrics negative OB ROS                            Lab Results  Component Value Date   WBC 6.5 03/14/2015   HGB 14.3 03/14/2015   HCT 42.9 03/14/2015   MCV 94.7 03/14/2015   PLT 217 03/14/2015   EKG: normal sinus rhythm.   Anesthesia Physical Anesthesia Plan  ASA: II  Anesthesia Plan: General   Post-op Pain Management:    Induction: Intravenous  Airway Management Planned: LMA  Additional Equipment:   Intra-op Plan:   Post-operative Plan: Extubation in OR  Informed Consent: I have reviewed the patients History and Physical, chart, labs and discussed the procedure including the risks, benefits and alternatives for the proposed anesthesia with the patient or authorized representative who has indicated his/her understanding and acceptance.   Dental advisory given  Plan Discussed with: CRNA  Anesthesia Plan Comments:         Anesthesia Quick Evaluation

## 2015-03-16 NOTE — Transfer of Care (Signed)
Last Vitals:  Filed Vitals:   03/16/15 0716  BP: 155/83  Pulse: 86  Temp: 36.6 C  Resp: 16   Immediate Anesthesia Transfer of Care Note  Patient: Carla Reilly  Procedure(s) Performed: Procedure(s) (LRB): DILATATION AND CURETTAGE /HYSTEROSCOPY (N/A)  Patient Location: PACU  Anesthesia Type: General  Level of Consciousness: awake, alert  and oriented  Airway & Oxygen Therapy: Patient Spontanous Breathing and Patient connected to face mask oxygen  Post-op Assessment: Report given to PACU RN and Post -op Vital signs reviewed and stable  Post vital signs: Reviewed and stable  Complications: No apparent anesthesia complications

## 2015-03-16 NOTE — Anesthesia Procedure Notes (Signed)
Procedure Name: LMA Insertion Date/Time: 03/16/2015 8:31 AM Performed by: Mechele Claude Pre-anesthesia Checklist: Patient identified, Emergency Drugs available, Suction available and Patient being monitored Patient Re-evaluated:Patient Re-evaluated prior to inductionOxygen Delivery Method: Circle System Utilized Preoxygenation: Pre-oxygenation with 100% oxygen Intubation Type: IV induction Ventilation: Mask ventilation without difficulty LMA: LMA inserted LMA Size: 4.0 Number of attempts: 1 Airway Equipment and Method: bite block Placement Confirmation: positive ETCO2 Tube secured with: Tape Dental Injury: Teeth and Oropharynx as per pre-operative assessment

## 2015-03-16 NOTE — Discharge Instructions (Signed)

## 2015-03-17 ENCOUNTER — Encounter (HOSPITAL_BASED_OUTPATIENT_CLINIC_OR_DEPARTMENT_OTHER): Payer: Self-pay | Admitting: Obstetrics & Gynecology

## 2015-03-17 NOTE — Op Note (Signed)
NAMEDAIANA, Carla Reilly               ACCOUNT NO.:  [id number]  MEDICAL RECORD NO.:  [id number]  LOCATION:                               FACILITY:  [facility]  PHYSICIAN:  Maisie Fus, M.D.   DATE OF BIRTH:  22-May-1935  DATE OF PROCEDURE:  03/16/2015 DATE OF DISCHARGE:  03/16/2015                              OPERATIVE REPORT   PREOPERATIVE DIAGNOSIS:  Postmenopausal bleeding, ultrasound finding of likely endometrial polyp.  POSTOPERATIVE DIAGNOSIS:  Endometrial polyp.  PROCEDURE:  Hysteroscopy, dilation and curettage, and polypectomy.  SURGEON:  Maisie Fus, M.D.  ANESTHESIA:  General.  ESTIMATED INTRAOPERATIVE BLOOD LOSS:  Less than 20 mL.  INTRAOPERATIVE COMPLICATIONS:  None.  Details of the present illness recorded in the admission note.  The patient was admitted on the morning of surgery.  She was brought to the operating room.  She was then placed under adequate general anesthesia, placed in the dorsal lithotomy position using the [facility] stirrup system. Betadine prep of mons perineum and vagina was carried out in the usual fashion.  Sterile drapes were applied.  Bivalve speculum was introduced. Cervix was visualized.  Paracervical block was placed using 5 mL on each side and a deep injection at 3 and 9 o'clock of 1% plain Xylocaine.  The anterior cervical lip was grasped with a tenaculum.  Os Finders were used to dilate the internal os.  The cervix was progressively dilated to 23 with Kennon Rounds.  Cystoscope was introduced.  The polyp was visualized and photographed.  Randall stone forceps were then used to remove the polyp without difficulty.  Gentle thorough curettage of the endometrium was carried out.  Repeat inspection revealed complete sampling of the endometrium and excision of the polyp.  The procedure was terminated. Instruments were removed.  The patient was awakened and taken to recovery in good condition.  She will be given routine outpatient surgical  instructions.  She is to use ibuprofen or Tylenol as needed for postoperative pain.  She is to return to the office in 1 week for postoperative followup.     Maisie Fus, M.D.     WRN/MEDQ  D:  03/16/2015  T:  03/17/2015  Job:  [id number]

## 2015-03-24 ENCOUNTER — Ambulatory Visit (INDEPENDENT_AMBULATORY_CARE_PROVIDER_SITE_OTHER): Payer: Medicare Other

## 2015-03-24 DIAGNOSIS — Z23 Encounter for immunization: Secondary | ICD-10-CM

## 2015-04-11 DIAGNOSIS — L209 Atopic dermatitis, unspecified: Secondary | ICD-10-CM | POA: Diagnosis not present

## 2015-05-05 ENCOUNTER — Other Ambulatory Visit: Payer: Self-pay | Admitting: Family Medicine

## 2015-05-05 MED ORDER — LEVOTHYROXINE SODIUM 100 MCG PO TABS: ORAL_TABLET | ORAL | Status: DC

## 2015-05-31 DIAGNOSIS — L259 Unspecified contact dermatitis, unspecified cause: Secondary | ICD-10-CM | POA: Diagnosis not present

## 2015-05-31 DIAGNOSIS — H01139 Eczematous dermatitis of unspecified eye, unspecified eyelid: Secondary | ICD-10-CM | POA: Diagnosis not present

## 2015-06-27 DIAGNOSIS — N950 Postmenopausal bleeding: Secondary | ICD-10-CM | POA: Diagnosis not present

## 2015-06-27 DIAGNOSIS — R634 Abnormal weight loss: Secondary | ICD-10-CM | POA: Diagnosis not present

## 2015-07-06 HISTORY — PX: TOTAL ABDOMINAL HYSTERECTOMY W/ BILATERAL SALPINGOOPHORECTOMY: SHX83

## 2015-07-21 DIAGNOSIS — Z01818 Encounter for other preprocedural examination: Secondary | ICD-10-CM | POA: Diagnosis not present

## 2015-07-21 DIAGNOSIS — N8500 Endometrial hyperplasia, unspecified: Secondary | ICD-10-CM | POA: Diagnosis not present

## 2015-07-21 DIAGNOSIS — N950 Postmenopausal bleeding: Secondary | ICD-10-CM | POA: Diagnosis not present

## 2015-07-26 ENCOUNTER — Other Ambulatory Visit: Payer: Self-pay | Admitting: Obstetrics & Gynecology

## 2015-07-26 DIAGNOSIS — N8501 Benign endometrial hyperplasia: Secondary | ICD-10-CM | POA: Diagnosis not present

## 2015-07-26 DIAGNOSIS — D270 Benign neoplasm of right ovary: Secondary | ICD-10-CM | POA: Diagnosis not present

## 2015-07-26 DIAGNOSIS — N8502 Endometrial intraepithelial neoplasia [EIN]: Secondary | ICD-10-CM | POA: Diagnosis not present

## 2015-07-26 DIAGNOSIS — N950 Postmenopausal bleeding: Secondary | ICD-10-CM | POA: Diagnosis not present

## 2015-10-18 DIAGNOSIS — L570 Actinic keratosis: Secondary | ICD-10-CM | POA: Diagnosis not present

## 2015-10-18 DIAGNOSIS — L2084 Intrinsic (allergic) eczema: Secondary | ICD-10-CM | POA: Diagnosis not present

## 2015-10-18 DIAGNOSIS — L821 Other seborrheic keratosis: Secondary | ICD-10-CM | POA: Diagnosis not present

## 2015-11-08 ENCOUNTER — Other Ambulatory Visit: Payer: Self-pay | Admitting: Family Medicine

## 2015-11-08 DIAGNOSIS — Z1231 Encounter for screening mammogram for malignant neoplasm of breast: Secondary | ICD-10-CM

## 2015-12-06 ENCOUNTER — Other Ambulatory Visit: Payer: Self-pay | Admitting: Family Medicine

## 2015-12-07 NOTE — Telephone Encounter (Signed)
Medication filled to pharmacy as requested.   

## 2015-12-20 DIAGNOSIS — L814 Other melanin hyperpigmentation: Secondary | ICD-10-CM | POA: Diagnosis not present

## 2015-12-20 DIAGNOSIS — L570 Actinic keratosis: Secondary | ICD-10-CM | POA: Diagnosis not present

## 2015-12-27 ENCOUNTER — Ambulatory Visit (HOSPITAL_BASED_OUTPATIENT_CLINIC_OR_DEPARTMENT_OTHER)
Admission: RE | Admit: 2015-12-27 | Discharge: 2015-12-27 | Disposition: A | Discharge status: Home / Self Care | Payer: Medicare Other | Source: Ambulatory Visit | Attending: Family Medicine | Admitting: Family Medicine

## 2015-12-27 ENCOUNTER — Ambulatory Visit (INDEPENDENT_AMBULATORY_CARE_PROVIDER_SITE_OTHER): Payer: Medicare Other | Admitting: Family Medicine

## 2015-12-27 ENCOUNTER — Encounter: Payer: Self-pay | Admitting: Family Medicine

## 2015-12-27 VITALS — BP 136/80 | HR 81 | Temp 98.3°F | Ht 65.0 in | Wt 203.4 lb

## 2015-12-27 DIAGNOSIS — Z1231 Encounter for screening mammogram for malignant neoplasm of breast: Secondary | ICD-10-CM | POA: Insufficient documentation

## 2015-12-27 DIAGNOSIS — R319 Hematuria, unspecified: Secondary | ICD-10-CM

## 2015-12-27 DIAGNOSIS — E039 Hypothyroidism, unspecified: Secondary | ICD-10-CM

## 2015-12-27 DIAGNOSIS — E785 Hyperlipidemia, unspecified: Secondary | ICD-10-CM | POA: Diagnosis not present

## 2015-12-27 DIAGNOSIS — Z0000 Encounter for general adult medical examination without abnormal findings: Secondary | ICD-10-CM

## 2015-12-27 LAB — POCT URINALYSIS DIPSTICK
Bilirubin, UA: NEGATIVE
Glucose, UA: NEGATIVE
Ketones, UA: NEGATIVE
LEUKOCYTES UA: NEGATIVE
NITRITE UA: NEGATIVE
PH UA: 5
PROTEIN UA: NEGATIVE
Spec Grav, UA: >=1.03
Urobilinogen, UA: 0.2

## 2015-12-27 MED ORDER — LEVOTHYROXINE SODIUM 100 MCG PO TABS: 100.0000 ug | ORAL_TABLET | Freq: Every day | ORAL | 3 refills | Status: DC

## 2015-12-27 MED ORDER — SIMVASTATIN 40 MG PO TABS
40.0000 mg | ORAL_TABLET | Freq: Every day | ORAL | 1 refills | Status: DC
Start: 2015-12-27 — End: 2016-05-07

## 2015-12-27 NOTE — Patient Instructions (Signed)
Preventive Care for Adults, Female A healthy lifestyle and preventive care can promote health and wellness. Preventive health guidelines for women include the following key practices.  A routine yearly physical is a good way to check with your health care provider about your health and preventive screening. It is a chance to share any concerns and updates on your health and to receive a thorough exam.  Visit your dentist for a routine exam and preventive care every 6 months. Brush your teeth twice a day and floss once a day. Good oral hygiene prevents tooth decay and gum disease.  The frequency of eye exams is based on your age, health, family medical history, use of contact lenses, and other factors. Follow your health care provider's recommendations for frequency of eye exams.  Eat a healthy diet. Foods like vegetables, fruits, whole grains, low-fat dairy products, and lean protein foods contain the nutrients you need without too many calories. Decrease your intake of foods high in solid fats, added sugars, and salt. Eat the right amount of calories for you.Get information about a proper diet from your health care provider, if necessary.  Regular physical exercise is one of the most important things you can do for your health. Most adults should get at least 150 minutes of moderate-intensity exercise (any activity that increases your heart rate and causes you to sweat) each week. In addition, most adults need muscle-strengthening exercises on 2 or more days a week.  Maintain a healthy weight. The body mass index (BMI) is a screening tool to identify possible weight problems. It provides an estimate of body fat based on height and weight. Your health care provider can find your BMI and can help you achieve or maintain a healthy weight.For adults 20 years and older:  A BMI below 18.5 is considered underweight.  A BMI of 18.5 to 24.9 is normal.  A BMI of 25 to 29.9 is considered overweight.  A  BMI of 30 and above is considered obese.  Maintain normal blood lipids and cholesterol levels by exercising and minimizing your intake of saturated fat. Eat a balanced diet with plenty of fruit and vegetables. Blood tests for lipids and cholesterol should begin at age 45 and be repeated every 5 years. If your lipid or cholesterol levels are high, you are over 50, or you are at high risk for heart disease, you may need your cholesterol levels checked more frequently.Ongoing high lipid and cholesterol levels should be treated with medicines if diet and exercise are not working.  If you smoke, find out from your health care provider how to quit. If you do not use tobacco, do not start.  Lung cancer screening is recommended for adults aged 45-80 years who are at high risk for developing lung cancer because of a history of smoking. A yearly low-dose CT scan of the lungs is recommended for people who have at least a 30-pack-year history of smoking and are a current smoker or have quit within the past 15 years. A pack year of smoking is smoking an average of 1 pack of cigarettes a day for 1 year (for example: 1 pack a day for 30 years or 2 packs a day for 15 years). Yearly screening should continue until the smoker has stopped smoking for at least 15 years. Yearly screening should be stopped for people who develop a health problem that would prevent them from having lung cancer treatment.  If you are pregnant, do not drink alcohol. If you are  breastfeeding, be very cautious about drinking alcohol. If you are not pregnant and choose to drink alcohol, do not have more than 1 drink per day. One drink is considered to be 12 ounces (355 mL) of beer, 5 ounces (148 mL) of wine, or 1.5 ounces (44 mL) of liquor.  Avoid use of street drugs. Do not share needles with anyone. Ask for help if you need support or instructions about stopping the use of drugs.  High blood pressure causes heart disease and increases the risk  of stroke. Your blood pressure should be checked at least every 1 to 2 years. Ongoing high blood pressure should be treated with medicines if weight loss and exercise do not work.  If you are 55-79 years old, ask your health care provider if you should take aspirin to prevent strokes.  Diabetes screening is done by taking a blood sample to check your blood glucose level after you have not eaten for a certain period of time (fasting). If you are not overweight and you do not have risk factors for diabetes, you should be screened once every 3 years starting at age 45. If you are overweight or obese and you are 40-70 years of age, you should be screened for diabetes every year as part of your cardiovascular risk assessment.  Breast cancer screening is essential preventive care for women. You should practice "breast self-awareness." This means understanding the normal appearance and feel of your breasts and may include breast self-examination. Any changes detected, no matter how small, should be reported to a health care provider. Women in their 20s and 30s should have a clinical breast exam (CBE) by a health care provider as part of a regular health exam every 1 to 3 years. After age 40, women should have a CBE every year. Starting at age 40, women should consider having a mammogram (breast X-ray test) every year. Women who have a family history of breast cancer should talk to their health care provider about genetic screening. Women at a high risk of breast cancer should talk to their health care providers about having an MRI and a mammogram every year.  Breast cancer gene (BRCA)-related cancer risk assessment is recommended for women who have family members with BRCA-related cancers. BRCA-related cancers include breast, ovarian, tubal, and peritoneal cancers. Having family members with these cancers may be associated with an increased risk for harmful changes (mutations) in the breast cancer genes BRCA1 and  BRCA2. Results of the assessment will determine the need for genetic counseling and BRCA1 and BRCA2 testing.  Your health care provider may recommend that you be screened regularly for cancer of the pelvic organs (ovaries, uterus, and vagina). This screening involves a pelvic examination, including checking for microscopic changes to the surface of your cervix (Pap test). You may be encouraged to have this screening done every 3 years, beginning at age 21.  For women ages 30-65, health care providers may recommend pelvic exams and Pap testing every 3 years, or they may recommend the Pap and pelvic exam, combined with testing for human papilloma virus (HPV), every 5 years. Some types of HPV increase your risk of cervical cancer. Testing for HPV may also be done on women of any age with unclear Pap test results.  Other health care providers may not recommend any screening for nonpregnant women who are considered low risk for pelvic cancer and who do not have symptoms. Ask your health care provider if a screening pelvic exam is right for   you.  If you have had past treatment for cervical cancer or a condition that could lead to cancer, you need Pap tests and screening for cancer for at least 20 years after your treatment. If Pap tests have been discontinued, your risk factors (such as having a new sexual partner) need to be reassessed to determine if screening should resume. Some women have medical problems that increase the chance of getting cervical cancer. In these cases, your health care provider may recommend more frequent screening and Pap tests.  Colorectal cancer can be detected and often prevented. Most routine colorectal cancer screening begins at the age of 50 years and continues through age 75 years. However, your health care provider may recommend screening at an earlier age if you have risk factors for colon cancer. On a yearly basis, your health care provider may provide home test kits to check  for hidden blood in the stool. Use of a small camera at the end of a tube, to directly examine the colon (sigmoidoscopy or colonoscopy), can detect the earliest forms of colorectal cancer. Talk to your health care provider about this at age 50, when routine screening begins. Direct exam of the colon should be repeated every 5-10 years through age 75 years, unless early forms of precancerous polyps or small growths are found.  People who are at an increased risk for hepatitis B should be screened for this virus. You are considered at high risk for hepatitis B if:  You were born in a country where hepatitis B occurs often. Talk with your health care provider about which countries are considered high risk.  Your parents were born in a high-risk country and you have not received a shot to protect against hepatitis B (hepatitis B vaccine).  You have HIV or AIDS.  You use needles to inject street drugs.  You live with, or have sex with, someone who has hepatitis B.  You get hemodialysis treatment.  You take certain medicines for conditions like cancer, organ transplantation, and autoimmune conditions.  Hepatitis C blood testing is recommended for all people born from 1945 through 1965 and any individual with known risks for hepatitis C.  Practice safe sex. Use condoms and avoid high-risk sexual practices to reduce the spread of sexually transmitted infections (STIs). STIs include gonorrhea, chlamydia, syphilis, trichomonas, herpes, HPV, and human immunodeficiency virus (HIV). Herpes, HIV, and HPV are viral illnesses that have no cure. They can result in disability, cancer, and death.  You should be screened for sexually transmitted illnesses (STIs) including gonorrhea and chlamydia if:  You are sexually active and are younger than 24 years.  You are older than 24 years and your health care provider tells you that you are at risk for this type of infection.  Your sexual activity has changed  since you were last screened and you are at an increased risk for chlamydia or gonorrhea. Ask your health care provider if you are at risk.  If you are at risk of being infected with HIV, it is recommended that you take a prescription medicine daily to prevent HIV infection. This is called preexposure prophylaxis (PrEP). You are considered at risk if:  You are sexually active and do not regularly use condoms or know the HIV status of your partner(s).  You take drugs by injection.  You are sexually active with a partner who has HIV.  Talk with your health care provider about whether you are at high risk of being infected with HIV. If   you choose to begin PrEP, you should first be tested for HIV. You should then be tested every 3 months for as long as you are taking PrEP.  Osteoporosis is a disease in which the bones lose minerals and strength with aging. This can result in serious bone fractures or breaks. The risk of osteoporosis can be identified using a bone density scan. Women ages 67 years and over and women at risk for fractures or osteoporosis should discuss screening with their health care providers. Ask your health care provider whether you should take a calcium supplement or vitamin D to reduce the rate of osteoporosis.  Menopause can be associated with physical symptoms and risks. Hormone replacement therapy is available to decrease symptoms and risks. You should talk to your health care provider about whether hormone replacement therapy is right for you.  Use sunscreen. Apply sunscreen liberally and repeatedly throughout the day. You should seek shade when your shadow is shorter than you. Protect yourself by wearing long sleeves, pants, a wide-brimmed hat, and sunglasses year round, whenever you are outdoors.  Once a month, do a whole body skin exam, using a mirror to look at the skin on your back. Tell your health care provider of new moles, moles that have irregular borders, moles that  are larger than a pencil eraser, or moles that have changed in shape or color.  Stay current with required vaccines (immunizations).  Influenza vaccine. All adults should be immunized every year.  Tetanus, diphtheria, and acellular pertussis (Td, Tdap) vaccine. Pregnant women should receive 1 dose of Tdap vaccine during each pregnancy. The dose should be obtained regardless of the length of time since the last dose. Immunization is preferred during the 27th-36th week of gestation. An adult who has not previously received Tdap or who does not know her vaccine status should receive 1 dose of Tdap. This initial dose should be followed by tetanus and diphtheria toxoids (Td) booster doses every 10 years. Adults with an unknown or incomplete history of completing a 3-dose immunization series with Td-containing vaccines should begin or complete a primary immunization series including a Tdap dose. Adults should receive a Td booster every 10 years.  Varicella vaccine. An adult without evidence of immunity to varicella should receive 2 doses or a second dose if she has previously received 1 dose. Pregnant females who do not have evidence of immunity should receive the first dose after pregnancy. This first dose should be obtained before leaving the health care facility. The second dose should be obtained 4-8 weeks after the first dose.  Human papillomavirus (HPV) vaccine. Females aged 13-26 years who have not received the vaccine previously should obtain the 3-dose series. The vaccine is not recommended for use in pregnant females. However, pregnancy testing is not needed before receiving a dose. If a female is found to be pregnant after receiving a dose, no treatment is needed. In that case, the remaining doses should be delayed until after the pregnancy. Immunization is recommended for any person with an immunocompromised condition through the age of 61 years if she did not get any or all doses earlier. During the  3-dose series, the second dose should be obtained 4-8 weeks after the first dose. The third dose should be obtained 24 weeks after the first dose and 16 weeks after the second dose.  Zoster vaccine. One dose is recommended for adults aged 30 years or older unless certain conditions are present.  Measles, mumps, and rubella (MMR) vaccine. Adults born  before 1957 generally are considered immune to measles and mumps. Adults born in 1957 or later should have 1 or more doses of MMR vaccine unless there is a contraindication to the vaccine or there is laboratory evidence of immunity to each of the three diseases. A routine second dose of MMR vaccine should be obtained at least 28 days after the first dose for students attending postsecondary schools, health care workers, or international travelers. People who received inactivated measles vaccine or an unknown type of measles vaccine during 1963-1967 should receive 2 doses of MMR vaccine. People who received inactivated mumps vaccine or an unknown type of mumps vaccine before 1979 and are at high risk for mumps infection should consider immunization with 2 doses of MMR vaccine. For females of childbearing age, rubella immunity should be determined. If there is no evidence of immunity, females who are not pregnant should be vaccinated. If there is no evidence of immunity, females who are pregnant should delay immunization until after pregnancy. Unvaccinated health care workers born before 1957 who lack laboratory evidence of measles, mumps, or rubella immunity or laboratory confirmation of disease should consider measles and mumps immunization with 2 doses of MMR vaccine or rubella immunization with 1 dose of MMR vaccine.  Pneumococcal 13-valent conjugate (PCV13) vaccine. When indicated, a person who is uncertain of his immunization history and has no record of immunization should receive the PCV13 vaccine. All adults 65 years of age and older should receive this  vaccine. An adult aged 19 years or older who has certain medical conditions and has not been previously immunized should receive 1 dose of PCV13 vaccine. This PCV13 should be followed with a dose of pneumococcal polysaccharide (PPSV23) vaccine. Adults who are at high risk for pneumococcal disease should obtain the PPSV23 vaccine at least 8 weeks after the dose of PCV13 vaccine. Adults older than 79 years of age who have normal immune system function should obtain the PPSV23 vaccine dose at least 1 year after the dose of PCV13 vaccine.  Pneumococcal polysaccharide (PPSV23) vaccine. When PCV13 is also indicated, PCV13 should be obtained first. All adults aged 65 years and older should be immunized. An adult younger than age 65 years who has certain medical conditions should be immunized. Any person who resides in a nursing home or long-term care facility should be immunized. An adult smoker should be immunized. People with an immunocompromised condition and certain other conditions should receive both PCV13 and PPSV23 vaccines. People with human immunodeficiency virus (HIV) infection should be immunized as soon as possible after diagnosis. Immunization during chemotherapy or radiation therapy should be avoided. Routine use of PPSV23 vaccine is not recommended for American Indians, [place] Natives, or people younger than 65 years unless there are medical conditions that require PPSV23 vaccine. When indicated, people who have unknown immunization and have no record of immunization should receive PPSV23 vaccine. One-time revaccination 5 years after the first dose of PPSV23 is recommended for people aged 19-64 years who have chronic kidney failure, nephrotic syndrome, asplenia, or immunocompromised conditions. People who received 1-2 doses of PPSV23 before age 65 years should receive another dose of PPSV23 vaccine at age 65 years or later if at least 5 years have passed since the previous dose. Doses of PPSV23 are not  needed for people immunized with PPSV23 at or after age 65 years.  Meningococcal vaccine. Adults with asplenia or persistent complement component deficiencies should receive 2 doses of quadrivalent meningococcal conjugate (MenACWY-D) vaccine. The doses should be obtained   at least 2 months apart. Microbiologists working with certain meningococcal bacteria, [place] recruits, people at risk during an outbreak, and people who travel to or live in countries with a high rate of meningitis should be immunized. A first-year college student up through age 34 years who is living in a residence hall should receive a dose if she did not receive a dose on or after her 16th birthday. Adults who have certain high-risk conditions should receive one or more doses of vaccine.  Hepatitis A vaccine. Adults who wish to be protected from this disease, have certain high-risk conditions, work with hepatitis A-infected animals, work in hepatitis A research labs, or travel to or work in countries with a high rate of hepatitis A should be immunized. Adults who were previously unvaccinated and who anticipate close contact with an international adoptee during the first 60 days after arrival in the [place] from a country with a high rate of hepatitis A should be immunized.  Hepatitis B vaccine. Adults who wish to be protected from this disease, have certain high-risk conditions, may be exposed to blood or other infectious body fluids, are household contacts or sex partners of hepatitis B positive people, are clients or workers in certain care facilities, or travel to or work in countries with a high rate of hepatitis B should be immunized.  Haemophilus influenzae type b (Hib) vaccine. A previously unvaccinated person with asplenia or sickle cell disease or having a scheduled splenectomy should receive 1 dose of Hib vaccine. Regardless of previous immunization, a recipient of a hematopoietic stem cell transplant should receive a  3-dose series 6-12 months after her successful transplant. Hib vaccine is not recommended for adults with HIV infection. Preventive Services / Frequency Ages 35 to 4 years  Blood pressure check.** / Every 3-5 years.  Lipid and cholesterol check.** / Every 5 years beginning at age 60.  Clinical breast exam.** / Every 3 years for women in their 71s and 10s.  BRCA-related cancer risk assessment.** / For women who have family members with a BRCA-related cancer (breast, ovarian, tubal, or peritoneal cancers).  Pap test.** / Every 2 years from ages 76 through 26. Every 3 years starting at age 61 through age 76 or 93 with a history of 3 consecutive normal Pap tests.  HPV screening.** / Every 3 years from ages 37 through ages 60 to 51 with a history of 3 consecutive normal Pap tests.  Hepatitis C blood test.** / For any individual with known risks for hepatitis C.  Skin self-exam. / Monthly.  Influenza vaccine. / Every year.  Tetanus, diphtheria, and acellular pertussis (Tdap, Td) vaccine.** / Consult your health care provider. Pregnant women should receive 1 dose of Tdap vaccine during each pregnancy. 1 dose of Td every 10 years.  Varicella vaccine.** / Consult your health care provider. Pregnant females who do not have evidence of immunity should receive the first dose after pregnancy.  HPV vaccine. / 3 doses over 6 months, if 93 and younger. The vaccine is not recommended for use in pregnant females. However, pregnancy testing is not needed before receiving a dose.  Measles, mumps, rubella (MMR) vaccine.** / You need at least 1 dose of MMR if you were born in 1957 or later. You may also need a 2nd dose. For females of childbearing age, rubella immunity should be determined. If there is no evidence of immunity, females who are not pregnant should be vaccinated. If there is no evidence of immunity, females who are  pregnant should delay immunization until after pregnancy.  Pneumococcal  13-valent conjugate (PCV13) vaccine.** / Consult your health care provider.  Pneumococcal polysaccharide (PPSV23) vaccine.** / 1 to 2 doses if you smoke cigarettes or if you have certain conditions.  Meningococcal vaccine.** / 1 dose if you are age 68 to 8 years and a Market researcher living in a residence hall, or have one of several medical conditions, you need to get vaccinated against meningococcal disease. You may also need additional booster doses.  Hepatitis A vaccine.** / Consult your health care provider.  Hepatitis B vaccine.** / Consult your health care provider.  Haemophilus influenzae type b (Hib) vaccine.** / Consult your health care provider. Ages 7 to 53 years  Blood pressure check.** / Every year.  Lipid and cholesterol check.** / Every 5 years beginning at age 25 years.  Lung cancer screening. / Every year if you are aged 11-80 years and have a 30-pack-year history of smoking and currently smoke or have quit within the past 15 years. Yearly screening is stopped once you have quit smoking for at least 15 years or develop a health problem that would prevent you from having lung cancer treatment.  Clinical breast exam.** / Every year after age 48 years.  BRCA-related cancer risk assessment.** / For women who have family members with a BRCA-related cancer (breast, ovarian, tubal, or peritoneal cancers).  Mammogram.** / Every year beginning at age 41 years and continuing for as long as you are in good health. Consult with your health care provider.  Pap test.** / Every 3 years starting at age 65 years through age 37 or 70 years with a history of 3 consecutive normal Pap tests.  HPV screening.** / Every 3 years from ages 72 years through ages 60 to 40 years with a history of 3 consecutive normal Pap tests.  Fecal occult blood test (FOBT) of stool. / Every year beginning at age 21 years and continuing until age 5 years. You may not need to do this test if you get  a colonoscopy every 10 years.  Flexible sigmoidoscopy or colonoscopy.** / Every 5 years for a flexible sigmoidoscopy or every 10 years for a colonoscopy beginning at age 35 years and continuing until age 48 years.  Hepatitis C blood test.** / For all people born from [birth year] through 1965 and any individual with known risks for hepatitis C.  Skin self-exam. / Monthly.  Influenza vaccine. / Every year.  Tetanus, diphtheria, and acellular pertussis (Tdap/Td) vaccine.** / Consult your health care provider. Pregnant women should receive 1 dose of Tdap vaccine during each pregnancy. 1 dose of Td every 10 years.  Varicella vaccine.** / Consult your health care provider. Pregnant females who do not have evidence of immunity should receive the first dose after pregnancy.  Zoster vaccine.** / 1 dose for adults aged 30 years or older.  Measles, mumps, rubella (MMR) vaccine.** / You need at least 1 dose of MMR if you were born in 1957 or later. You may also need a second dose. For females of childbearing age, rubella immunity should be determined. If there is no evidence of immunity, females who are not pregnant should be vaccinated. If there is no evidence of immunity, females who are pregnant should delay immunization until after pregnancy.  Pneumococcal 13-valent conjugate (PCV13) vaccine.** / Consult your health care provider.  Pneumococcal polysaccharide (PPSV23) vaccine.** / 1 to 2 doses if you smoke cigarettes or if you have certain conditions.  Meningococcal vaccine.** /  Consult your health care provider.  Hepatitis A vaccine.** / Consult your health care provider.  Hepatitis B vaccine.** / Consult your health care provider.  Haemophilus influenzae type b (Hib) vaccine.** / Consult your health care provider. Ages 64 years and over  Blood pressure check.** / Every year.  Lipid and cholesterol check.** / Every 5 years beginning at age 23 years.  Lung cancer screening. / Every year if you  are aged 16-80 years and have a 30-pack-year history of smoking and currently smoke or have quit within the past 15 years. Yearly screening is stopped once you have quit smoking for at least 15 years or develop a health problem that would prevent you from having lung cancer treatment.  Clinical breast exam.** / Every year after age 74 years.  BRCA-related cancer risk assessment.** / For women who have family members with a BRCA-related cancer (breast, ovarian, tubal, or peritoneal cancers).  Mammogram.** / Every year beginning at age 44 years and continuing for as long as you are in good health. Consult with your health care provider.  Pap test.** / Every 3 years starting at age 58 years through age 22 or 39 years with 3 consecutive normal Pap tests. Testing can be stopped between 65 and 70 years with 3 consecutive normal Pap tests and no abnormal Pap or HPV tests in the past 10 years.  HPV screening.** / Every 3 years from ages 64 years through ages 70 or 61 years with a history of 3 consecutive normal Pap tests. Testing can be stopped between 65 and 70 years with 3 consecutive normal Pap tests and no abnormal Pap or HPV tests in the past 10 years.  Fecal occult blood test (FOBT) of stool. / Every year beginning at age 40 years and continuing until age 27 years. You may not need to do this test if you get a colonoscopy every 10 years.  Flexible sigmoidoscopy or colonoscopy.** / Every 5 years for a flexible sigmoidoscopy or every 10 years for a colonoscopy beginning at age 7 years and continuing until age 32 years.  Hepatitis C blood test.** / For all people born from [birth year] through 1965 and any individual with known risks for hepatitis C.  Osteoporosis screening.** / A one-time screening for women ages 30 years and over and women at risk for fractures or osteoporosis.  Skin self-exam. / Monthly.  Influenza vaccine. / Every year.  Tetanus, diphtheria, and acellular pertussis (Tdap/Td)  vaccine.** / 1 dose of Td every 10 years.  Varicella vaccine.** / Consult your health care provider.  Zoster vaccine.** / 1 dose for adults aged 35 years or older.  Pneumococcal 13-valent conjugate (PCV13) vaccine.** / Consult your health care provider.  Pneumococcal polysaccharide (PPSV23) vaccine.** / 1 dose for all adults aged 46 years and older.  Meningococcal vaccine.** / Consult your health care provider.  Hepatitis A vaccine.** / Consult your health care provider.  Hepatitis B vaccine.** / Consult your health care provider.  Haemophilus influenzae type b (Hib) vaccine.** / Consult your health care provider. ** Family history and personal history of risk and conditions may change your health care provider's recommendations.   This information is not intended to replace advice given to you by your health care provider. Make sure you discuss any questions you have with your health care provider.   Document Released: 07/17/2001 Document Revised: 06/11/2014 Document Reviewed: 10/16/2010 Elsevier Interactive Patient Education [employer].

## 2015-12-27 NOTE — Progress Notes (Signed)
Subjective:   Carla Reilly is a 81 y.o. female who presents for Medicare Annual (Subsequent) preventive examination.  Review of Systems:     Review of Systems  Constitutional: Negative for activity change, appetite change and fatigue.  HENT: Negative for hearing loss, congestion, tinnitus and ear discharge.   Eyes: Negative for visual disturbance (see optho q1y -- vision corrected to 20/20 with glasses).  Respiratory: Negative for cough, chest tightness and shortness of breath.   Cardiovascular: Negative for chest pain, palpitations and leg swelling.  Gastrointestinal: Negative for abdominal pain, diarrhea, constipation and abdominal distention.  Genitourinary: Negative for urgency, frequency, decreased urine volume and difficulty urinating.  Musculoskeletal: Negative for back pain, arthralgias and gait problem.  Skin: Negative for color change, pallor and rash.  Neurological: Negative for dizziness, light-headedness, numbness and headaches.  Hematological: Negative for adenopathy. Does not bruise/bleed easily.  Psychiatric/Behavioral: Negative for suicidal ideas, confusion, sleep disturbance, self-injury, dysphoric mood, decreased concentration and agitation.  Pt is able to read and write and can do all ADLs No risk for falling No abuse/ violence in home       Objective:     Vitals: BP 136/80 (BP Location: Left Arm, Patient Position: Sitting, Cuff Size: Large)   Pulse 81   Temp 98.3 F (36.8 C) (Oral)   Ht 5\' 5"  (1.651 m)   Wt 203 lb 6.4 oz (92.3 kg)   SpO2 98%   BMI 33.85 kg/m   Body mass index is 33.85 kg/m.  BP 136/80 (BP Location: Left Arm, Patient Position: Sitting, Cuff Size: Large)   Pulse 81   Temp 98.3 F (36.8 C) (Oral)   Ht 5\' 5"  (1.651 m)   Wt 203 lb 6.4 oz (92.3 kg)   SpO2 98%   BMI 33.85 kg/m  General appearance: alert, cooperative, appears stated age and no distress Head: Normocephalic, without obvious abnormality, atraumatic Eyes:  conjunctivae/corneas clear. PERRL, EOM's intact. Fundi benign. Ears: normal TM's and external ear canals both ears Nose: Nares normal. Septum midline. Mucosa normal. No drainage or sinus tenderness. Throat: lips, mucosa, and tongue normal; teeth and gums normal Neck: no adenopathy, no carotid bruit, no JVD, supple, symmetrical, trachea midline and thyroid not enlarged, symmetric, no tenderness/mass/nodules Back: symmetric, no curvature. ROM normal. No CVA tenderness. Lungs: clear to auscultation bilaterally Breasts: gyn Heart: regular rate and rhythm, S1, S2 normal, no murmur, click, rub or gallop Abdomen: soft, non-tender; bowel sounds normal; no masses,  no organomegaly Pelvic: deferred --gyn Extremities: extremities normal, atraumatic, no cyanosis or edema Pulses: 2+ and symmetric Skin: Skin color, texture, turgor normal. No rashes or lesions Lymph nodes: Cervical, supraclavicular, and axillary nodes normal. Neurologic: Alert and oriented X 3, normal strength and tone. Normal symmetric reflexes. Normal coordination and gait Tobacco History  Smoking Status  . Former Smoker  . Quit date: 06/04/1972  Smokeless Tobacco  . Never Used     Counseling given: No   Past Medical History:  Diagnosis Date  . Carpal tunnel syndrome   . Cataracts, bilateral   . Hyperlipidemia   . Thyroid disease    Hypothyroidism  . Wears dentures   . Wears glasses    Past Surgical History:  Procedure Laterality Date  . DILATION AND CURETTAGE OF UTERUS  1980  . HYSTEROSCOPY W/D&C N/A 03/16/2015   Procedure: DILATATION AND CURETTAGE /HYSTEROSCOPY;  Surgeon: Maisie Fus, MD;  Location: [facility];  Service: Gynecology;  Laterality: N/A;  . TONSILLECTOMY    . TOTAL  ABDOMINAL HYSTERECTOMY W/ BILATERAL SALPINGOOPHORECTOMY Bilateral 07/2015   Dr Hurley Cisco   Family History  Problem Relation Age of Onset  . Prostate cancer Father   . Cancer Father     prostate  . Hypothyroidism Mother   .  Dementia Mother     died at 52yo  . Hypertension Brother   . Colon cancer Son 53   History  Sexual Activity  . Sexual activity: Yes  . Partners: Male    Outpatient Encounter Prescriptions as of 12/27/2015  Medication Sig  . CHROMIUM PO Take 800 mcg by mouth daily.   . Coenzyme Q10 (VITALINE COQ10 PO) Take 600 mg by mouth.  . EPINEPHrine 0.3 mg/0.3 mL IJ SOAJ injection Inject 0.3 mLs (0.3 mg total) into the muscle once.  . Lutein 40 MG CAPS Take 1 capsule by mouth daily.  . Multiple Vitamin (MULTIVITAMIN) tablet Take 1 tablet by mouth daily.    Marland Kitchen Specialty Vitamins Products (RA EAR CARE) TABS Take 4 tablets by mouth daily.  . [DISCONTINUED] levothyroxine (SYNTHROID, LEVOTHROID) 100 MCG tablet Take 1 tablet by mouth  daily  . [DISCONTINUED] simvastatin (ZOCOR) 40 MG tablet Take 1 tablet by mouth at  bedtime  . levothyroxine (SYNTHROID, LEVOTHROID) 100 MCG tablet Take 1 tablet (100 mcg total) by mouth daily.  . simvastatin (ZOCOR) 40 MG tablet Take 1 tablet (40 mg total) by mouth at bedtime.  . [DISCONTINUED] Calcium Carbonate-Vitamin D (CALCIUM 600 + D PO) Take by mouth 2 (two) times daily.   . [DISCONTINUED] fish oil-omega-3 fatty acids 1000 MG capsule Take 1 g by mouth 2 (two) times daily.    . [DISCONTINUED] GLUCOSAMINE PO Take 1,000 mg by mouth daily.   . [DISCONTINUED] vitamin B-12 (CYANOCOBALAMIN) 1000 MCG tablet Take 1,000 mcg by mouth daily.     No facility-administered encounter medications on file as of 12/27/2015.     Activities of Daily Living In your present state of health, do you have any difficulty performing the following activities: 12/27/2015 03/16/2015  Hearing? N N  Vision? N N  Difficulty concentrating or making decisions? N N  Walking or climbing stairs? N N  Dressing or bathing? N N  Doing errands, shopping? N -  Some recent data might be hidden    Patient Care Team: Ann Held, DO as PCP - [place], OD (Optometry) Viona Gilmore Evette Cristal, MD as Consulting Physician (Obstetrics and Gynecology) Clista Bernhardt, MD as Referring Physician (Dermatology) Ephriam Jenkins, MD as Referring Physician (Allergy)    Assessment:    cpe Exercise Activities and Dietary recommendations Current Exercise Habits: The patient does not participate in regular exercise at present, Exercise limited by: None identified  Goals    None     Fall Risk Fall Risk  12/27/2015 12/03/2014 12/03/2014 10/16/2013  Falls in the past year? No No No No   Depression Screen PHQ 2/9 Scores 12/27/2015 12/03/2014 12/03/2014 10/16/2013  PHQ - 2 Score 0 0 0 0     Cognitive Testing MMSE - Mini Mental State Exam 12/27/2015  Orientation to time 5  Orientation to Place 5  Registration 3  Attention/ Calculation 5  Recall 3  Language- name 2 objects 2  Language- repeat 1  Language- follow 3 step command 3  Language- read & follow direction 1  Write a sentence 1  Copy design 1  Total score 30    Immunization History  Administered Date(s) Administered  . Influenza Split 03/20/2011, 04/03/2012  .  Influenza Whole 04/04/2006, 04/25/2007, 03/05/2008, 03/18/2009, 03/10/2010  . Influenza,inj,Quad PF,36+ Mos 03/31/2013, 03/04/2014, 03/24/2015  . Pneumococcal Conjugate-13 12/03/2014  . Pneumococcal Polysaccharide-23 06/05/2003  . Td 06/04/2001  . Zoster 10/16/2010   Screening Tests Health Maintenance  Topic Date Due  . INFLUENZA VACCINE  01/03/2016  . TETANUS/TDAP  10/29/2023  . DEXA SCAN  Completed  . ZOSTAVAX  Completed  . PNA vac Low Risk Adult  Completed      Plan:     See avs During the course of the visit the patient was educated and counseled about the following appropriate screening and preventive services:   Vaccines to include Pneumoccal, Influenza, Hepatitis B, Td, Zostavax, HCV  Electrocardiogram  Cardiovascular Disease  Colorectal cancer screening  Bone density screening  Diabetes screening  Glaucoma  screening  Mammography/PAP  Nutrition counseling   Patient Instructions (the written plan) was given to the patient.  1. Hyperlipidemia Check labs Not new - simvastatin (ZOCOR) 40 MG tablet; Take 1 tablet (40 mg total) by mouth at bedtime.  Dispense: 90 tablet; Refill: 1 - Comprehensive metabolic panel - Lipid panel - CBC with Differential/Platelet - POCT urinalysis dipstick - TSH  2. Hypothyroidism, unspecified hypothyroidism type Not new - levothyroxine (SYNTHROID, LEVOTHROID) 100 MCG tablet; Take 1 tablet (100 mcg total) by mouth daily.  Dispense: 90 tablet; Refill: 3 - TSH  3. Preventative health care See above  Ann Held, DO  12/27/2015

## 2015-12-28 LAB — COMPREHENSIVE METABOLIC PANEL
ALT: 17 U/L (ref 0–35)
AST: 16 U/L (ref 0–37)
Albumin: 4.4 g/dL (ref 3.5–5.2)
Alkaline Phosphatase: 80 U/L (ref 39–117)
BUN: 16 mg/dL (ref 6–23)
CALCIUM: 10 mg/dL (ref 8.4–10.5)
CHLORIDE: 105 meq/L (ref 96–112)
CO2: 30 mEq/L (ref 19–32)
CREATININE: 0.94 mg/dL (ref 0.40–1.20)
GFR: 61.1 mL/min (ref >60.00)
Glucose, Bld: 88 mg/dL (ref 70–99)
Potassium: 4.9 mEq/L (ref 3.5–5.1)
Sodium: 142 mEq/L (ref 135–145)
Total Bilirubin: 0.5 mg/dL (ref 0.2–1.2)
Total Protein: 7.6 g/dL (ref 6.0–8.3)

## 2015-12-28 LAB — URINE CULTURE: Organism ID, Bacteria: 10000

## 2015-12-28 LAB — LIPID PANEL
Cholesterol: 166 mg/dL (ref 0–200)
HDL: 65.6 mg/dL (ref >39.00)
LDL CALC: 83 mg/dL (ref 0–99)
NonHDL: 100.41
TRIGLYCERIDES: 88 mg/dL (ref 0.0–149.0)
Total CHOL/HDL Ratio: 3
VLDL: 17.6 mg/dL (ref 0.0–40.0)

## 2015-12-28 LAB — CBC WITH DIFFERENTIAL/PLATELET
BASOS ABS: 0 10*3/uL (ref 0.0–0.1)
Basophils Relative: 0.2 % (ref 0.0–3.0)
EOS ABS: 0.3 10*3/uL (ref 0.0–0.7)
Eosinophils Relative: 4.8 % (ref 0.0–5.0)
HCT: 43.1 % (ref 36.0–46.0)
Hemoglobin: 14.5 g/dL (ref 12.0–15.0)
LYMPHS ABS: 2.7 10*3/uL (ref 0.7–4.0)
Lymphocytes Relative: 39.5 % (ref 12.0–46.0)
MCHC: 33.6 g/dL (ref 30.0–36.0)
MCV: 93.2 fl (ref 78.0–100.0)
Monocytes Absolute: 0.5 10*3/uL (ref 0.1–1.0)
Monocytes Relative: 7.3 % (ref 3.0–12.0)
NEUTROS ABS: 3.3 10*3/uL (ref 1.4–7.7)
NEUTROS PCT: 48.2 % (ref 43.0–77.0)
PLATELETS: 216 10*3/uL (ref 150.0–400.0)
RBC: 4.63 Mil/uL (ref 3.87–5.11)
RDW: 13.7 % (ref 11.5–15.5)
WBC: 6.9 10*3/uL (ref 4.0–10.5)

## 2015-12-28 LAB — TSH: TSH: 0.96 u[IU]/mL (ref 0.35–4.50)

## 2016-01-12 DIAGNOSIS — Z6833 Body mass index (BMI) 33.0-33.9, adult: Secondary | ICD-10-CM | POA: Diagnosis not present

## 2016-01-12 DIAGNOSIS — Z01419 Encounter for gynecological examination (general) (routine) without abnormal findings: Secondary | ICD-10-CM | POA: Diagnosis not present

## 2016-01-18 ENCOUNTER — Telehealth: Payer: Self-pay | Admitting: Family Medicine

## 2016-01-18 NOTE — Telephone Encounter (Signed)
°  Relationship to patient: Self Can be reached: [phone]    Reason for call: Request call back to discuss if she can do the [facility] test instead of having a Colonoscopy.

## 2016-01-19 NOTE — Telephone Encounter (Signed)
Please advise      KP 

## 2016-01-19 NOTE — Telephone Encounter (Signed)
Information mailed and cologuard ordered via the portal. The patient is aware and has agreed.

## 2016-01-19 NOTE — Telephone Encounter (Signed)
Ok to give her info and send to co-- they will call her to discuss

## 2016-01-31 DIAGNOSIS — Z1212 Encounter for screening for malignant neoplasm of rectum: Secondary | ICD-10-CM | POA: Diagnosis not present

## 2016-01-31 DIAGNOSIS — Z1211 Encounter for screening for malignant neoplasm of colon: Secondary | ICD-10-CM | POA: Diagnosis not present

## 2016-01-31 LAB — COLOGUARD: Cologuard: NEGATIVE

## 2016-03-08 ENCOUNTER — Ambulatory Visit (INDEPENDENT_AMBULATORY_CARE_PROVIDER_SITE_OTHER): Payer: Medicare Other

## 2016-03-08 DIAGNOSIS — Z23 Encounter for immunization: Secondary | ICD-10-CM

## 2016-03-12 DIAGNOSIS — H5203 Hypermetropia, bilateral: Secondary | ICD-10-CM | POA: Diagnosis not present

## 2016-03-16 ENCOUNTER — Encounter: Payer: Self-pay | Admitting: Family Medicine

## 2016-03-27 DIAGNOSIS — H25813 Combined forms of age-related cataract, bilateral: Secondary | ICD-10-CM | POA: Diagnosis not present

## 2016-03-27 DIAGNOSIS — H43811 Vitreous degeneration, right eye: Secondary | ICD-10-CM | POA: Diagnosis not present

## 2016-04-02 DIAGNOSIS — H2511 Age-related nuclear cataract, right eye: Secondary | ICD-10-CM | POA: Diagnosis not present

## 2016-04-02 DIAGNOSIS — H25811 Combined forms of age-related cataract, right eye: Secondary | ICD-10-CM | POA: Diagnosis not present

## 2016-04-23 DIAGNOSIS — H2512 Age-related nuclear cataract, left eye: Secondary | ICD-10-CM | POA: Diagnosis not present

## 2016-04-23 DIAGNOSIS — H25812 Combined forms of age-related cataract, left eye: Secondary | ICD-10-CM | POA: Diagnosis not present

## 2016-05-07 ENCOUNTER — Other Ambulatory Visit: Payer: Self-pay | Admitting: Family Medicine

## 2016-05-07 DIAGNOSIS — E785 Hyperlipidemia, unspecified: Secondary | ICD-10-CM

## 2016-06-06 ENCOUNTER — Telehealth: Payer: Self-pay | Admitting: Family Medicine

## 2016-06-06 NOTE — Telephone Encounter (Signed)
Spoke with pt, pt states the letter she received with lab results say to recheck in 6 mths. Advise pt at her last ov which was her annual physical and all her labs were normal. Will forward message to provider, then update pt tomorrow if she need repeat lab work or just a follow up appointment. Pt had no further questions or concerns. LB

## 2016-06-06 NOTE — Telephone Encounter (Signed)
Pt called in to have labs per PCP. Not showing order has been placed to show what labs are needed.    Please advise.

## 2016-11-19 ENCOUNTER — Other Ambulatory Visit: Payer: Self-pay | Admitting: Family Medicine

## 2016-11-19 DIAGNOSIS — Z1231 Encounter for screening mammogram for malignant neoplasm of breast: Secondary | ICD-10-CM

## 2016-11-30 ENCOUNTER — Other Ambulatory Visit: Payer: Self-pay | Admitting: Family Medicine

## 2016-11-30 DIAGNOSIS — E039 Hypothyroidism, unspecified: Secondary | ICD-10-CM

## 2016-11-30 DIAGNOSIS — E785 Hyperlipidemia, unspecified: Secondary | ICD-10-CM

## 2017-01-21 ENCOUNTER — Ambulatory Visit (HOSPITAL_BASED_OUTPATIENT_CLINIC_OR_DEPARTMENT_OTHER)
Admission: RE | Admit: 2017-01-21 | Discharge: 2017-01-21 | Disposition: A | Discharge status: Home / Self Care | Payer: Medicare Other | Source: Ambulatory Visit | Attending: Family Medicine | Admitting: Family Medicine

## 2017-01-21 DIAGNOSIS — Z1231 Encounter for screening mammogram for malignant neoplasm of breast: Secondary | ICD-10-CM | POA: Diagnosis not present

## 2017-01-25 ENCOUNTER — Encounter: Payer: Self-pay | Admitting: Family Medicine

## 2017-01-25 ENCOUNTER — Ambulatory Visit (INDEPENDENT_AMBULATORY_CARE_PROVIDER_SITE_OTHER): Payer: Medicare Other | Admitting: Family Medicine

## 2017-01-25 ENCOUNTER — Ambulatory Visit (HOSPITAL_BASED_OUTPATIENT_CLINIC_OR_DEPARTMENT_OTHER): Payer: Medicare Other

## 2017-01-25 VITALS — BP 143/62 | HR 72 | Temp 98.0°F | Resp 18 | Ht 65.0 in | Wt 200.0 lb

## 2017-01-25 DIAGNOSIS — R319 Hematuria, unspecified: Secondary | ICD-10-CM | POA: Diagnosis not present

## 2017-01-25 DIAGNOSIS — E785 Hyperlipidemia, unspecified: Secondary | ICD-10-CM | POA: Diagnosis not present

## 2017-01-25 DIAGNOSIS — E039 Hypothyroidism, unspecified: Secondary | ICD-10-CM | POA: Diagnosis not present

## 2017-01-25 LAB — POC URINALSYSI DIPSTICK (AUTOMATED)
Bilirubin, UA: NEGATIVE
Glucose, UA: NEGATIVE
KETONES UA: NEGATIVE
Leukocytes, UA: NEGATIVE
Nitrite, UA: NEGATIVE
PH UA: 6 (ref 5.0–8.0)
PROTEIN UA: NEGATIVE
SPEC GRAV UA: 1.025 (ref 1.010–1.025)
Urobilinogen, UA: 0.2 E.U./dL

## 2017-01-25 LAB — CBC WITH DIFFERENTIAL/PLATELET
BASOS ABS: 75 {cells}/uL (ref 0–200)
Basophils Relative: 1 %
Eosinophils Absolute: 375 cells/uL (ref 15–500)
Eosinophils Relative: 5 %
HEMATOCRIT: 43 % (ref 35.0–45.0)
Hemoglobin: 14.2 g/dL (ref 11.7–15.5)
LYMPHS PCT: 39 %
Lymphs Abs: 2925 cells/uL (ref 850–3900)
MCH: 31.3 pg (ref 27.0–33.0)
MCHC: 33 g/dL (ref 32.0–36.0)
MCV: 94.7 fL (ref 80.0–100.0)
MONO ABS: 525 {cells}/uL (ref 200–950)
MPV: 10 fL (ref 7.5–12.5)
Monocytes Relative: 7 %
NEUTROS PCT: 48 %
Neutro Abs: 3600 cells/uL (ref 1500–7800)
Platelets: 218 10*3/uL (ref 140–400)
RBC: 4.54 MIL/uL (ref 3.80–5.10)
RDW: 13.6 % (ref 11.0–15.0)
WBC: 7.5 10*3/uL (ref 3.8–10.8)

## 2017-01-25 NOTE — Patient Instructions (Signed)
Preventive Care 65 Years and Older, Female Preventive care refers to lifestyle choices and visits with your health care provider that can promote health and wellness. What does preventive care include?  A yearly physical exam. This is also called an annual well check.  Dental exams once or twice a year.  Routine eye exams. Ask your health care provider how often you should have your eyes checked.  Personal lifestyle choices, including: ? Daily care of your teeth and gums. ? Regular physical activity. ? Eating a healthy diet. ? Avoiding tobacco and drug use. ? Limiting alcohol use. ? Practicing safe sex. ? Taking low-dose aspirin every day. ? Taking vitamin and mineral supplements as recommended by your health care provider. What happens during an annual well check? The services and screenings done by your health care provider during your annual well check will depend on your age, overall health, lifestyle risk factors, and family history of disease. Counseling Your health care provider may ask you questions about your:  Alcohol use.  Tobacco use.  Drug use.  Emotional well-being.  Home and relationship well-being.  Sexual activity.  Eating habits.  History of falls.  Memory and ability to understand (cognition).  Work and work environment.  Reproductive health.  Screening You may have the following tests or measurements:  Height, weight, and BMI.  Blood pressure.  Lipid and cholesterol levels. These may be checked every 5 years, or more frequently if you are over 50 years old.  Skin check.  Lung cancer screening. You may have this screening every year starting at age 55 if you have a 30-pack-year history of smoking and currently smoke or have quit within the past 15 years.  Fecal occult blood test (FOBT) of the stool. You may have this test every year starting at age 50.  Flexible sigmoidoscopy or colonoscopy. You may have a sigmoidoscopy every 5 years or  a colonoscopy every 10 years starting at age 50.  Hepatitis C blood test.  Hepatitis B blood test.  Sexually transmitted disease (STD) testing.  Diabetes screening. This is done by checking your blood sugar (glucose) after you have not eaten for a while (fasting). You may have this done every 1-3 years.  Bone density scan. This is done to screen for osteoporosis. You may have this done starting at age 80.  Mammogram. This may be done every 1-2 years. Talk to your health care provider about how often you should have regular mammograms.  Talk with your health care provider about your test results, treatment options, and if necessary, the need for more tests. Vaccines Your health care provider may recommend certain vaccines, such as:  Influenza vaccine. This is recommended every year.  Tetanus, diphtheria, and acellular pertussis (Tdap, Td) vaccine. You may need a Td booster every 10 years.  Varicella vaccine. You may need this if you have not been vaccinated.  Zoster vaccine. You may need this after age 60.  Measles, mumps, and rubella (MMR) vaccine. You may need at least one dose of MMR if you were born in 1957 or later. You may also need a second dose.  Pneumococcal 13-valent conjugate (PCV13) vaccine. One dose is recommended after age 80.  Pneumococcal polysaccharide (PPSV23) vaccine. One dose is recommended after age 80.  Meningococcal vaccine. You may need this if you have certain conditions.  Hepatitis A vaccine. You may need this if you have certain conditions or if you travel or work in places where you may be exposed to hepatitis   A.  Hepatitis B vaccine. You may need this if you have certain conditions or if you travel or work in places where you may be exposed to hepatitis B.  Haemophilus influenzae type b (Hib) vaccine. You may need this if you have certain conditions.  Talk to your health care provider about which screenings and vaccines you need and how often you  need them. This information is not intended to replace advice given to you by your health care provider. Make sure you discuss any questions you have with your health care provider. Document Released: 06/17/2015 Document Revised: 02/08/2016 Document Reviewed: 03/22/2015 Elsevier Interactive Patient Education  2017 [employer].

## 2017-01-25 NOTE — Progress Notes (Signed)
Subjective:     Carla Reilly is a 82 y.o. female and is here for a comprehensive physical exam. The patient reports no problems.  Social History   Social History  . Marital status: Widowed    Spouse name: N/A  . Number of children: 2  . Years of education: 13   Occupational History  .  [employer]    retired 12/2014   Social History Main Topics  . Smoking status: Former Smoker    Quit date: 06/04/1972  . Smokeless tobacco: Never Used  . Alcohol use No  . Drug use: No  . Sexual activity: Yes    Partners: Male   Other Topics Concern  . Not on file   Social History Narrative  . No narrative on file   Health Maintenance  Topic Date Due  . INFLUENZA VACCINE  01/02/2017  . TETANUS/TDAP  10/29/2023  . DEXA SCAN  Completed  . PNA vac Low Risk Adult  Completed    The following portions of the patient's history were reviewed and updated as appropriate:  She  has a past medical history of Carpal tunnel syndrome; Cataracts, bilateral; Hyperlipidemia; Thyroid disease; Wears dentures; and Wears glasses. She  does not have any pertinent problems on file. She  has a past surgical history that includes Tonsillectomy; Dilation and curettage of uterus (1980); Hysteroscopy w/D&C (N/A, 03/16/2015); Total abdominal hysterectomy w/ bilateral salpingoophorectomy (Bilateral, 07/2015); and CATARACTS REMOVED OCT & NOV 2017. Her family history includes Cancer in her father; Colon cancer (age of onset: 97) in her son; Dementia in her mother; Hypertension in her brother; Hypothyroidism in her mother; Prostate cancer in her father. She  reports that she quit smoking about 44 years ago. She has never used smokeless tobacco. She reports that she does not drink alcohol or use drugs. She has a current medication list which includes the following prescription(s): chromium, coenzyme q10, epinephrine, levothyroxine, lutein, multivitamin, simvastatin, and ra ear care. Current Outpatient Prescriptions on File  Prior to Visit  Medication Sig Dispense Refill  . CHROMIUM PO Take 800 mcg by mouth daily.     . Coenzyme Q10 (VITALINE COQ10 PO) Take 600 mg by mouth.    . EPINEPHrine 0.3 mg/0.3 mL IJ SOAJ injection Inject 0.3 mLs (0.3 mg total) into the muscle once. 3 Device 0  . levothyroxine (SYNTHROID, LEVOTHROID) 100 MCG tablet TAKE 1 TABLET BY MOUTH  DAILY 90 tablet 1  . Lutein 40 MG CAPS Take 1 capsule by mouth daily.    . Multiple Vitamin (MULTIVITAMIN) tablet Take 1 tablet by mouth daily.      . simvastatin (ZOCOR) 40 MG tablet TAKE 1 TABLET BY MOUTH AT  BEDTIME 90 tablet 1  . Specialty Vitamins Products (RA EAR CARE) TABS Take 4 tablets by mouth daily.     No current facility-administered medications on file prior to visit.    She is allergic to yellow jacket venom [bee venom]..  Review of Systems Review of Systems  Constitutional: Negative for activity change, appetite change and fatigue.  HENT: Negative for hearing loss, congestion, tinnitus and ear discharge.  dentist q60m Eyes: Negative for visual disturbance (see optho q1y -- vision corrected to 20/20 with glasses).  Respiratory: Negative for cough, chest tightness and shortness of breath.   Cardiovascular: Negative for chest pain, palpitations and leg swelling.  Gastrointestinal: Negative for abdominal pain, diarrhea, constipation and abdominal distention.  Genitourinary: Negative for urgency, frequency, decreased urine volume and difficulty urinating.  Musculoskeletal: Negative for  back pain, arthralgias and gait problem.  Skin: Negative for color change, pallor and rash.  Neurological: Negative for dizziness, light-headedness, numbness and headaches.  Hematological: Negative for adenopathy. Does not bruise/bleed easily.  Psychiatric/Behavioral: Negative for suicidal ideas, confusion, sleep disturbance, self-injury, dysphoric mood, decreased concentration and agitation.       Objective:    BP (!) 143/62   Pulse 72   Temp 98 F  (36.7 C)   Resp 18   Ht 5\' 5"  (1.651 m)   Wt 200 lb (90.7 kg)   SpO2 100%   BMI 33.28 kg/m  General appearance: alert, cooperative, appears stated age and no distress Head: Normocephalic, without obvious abnormality, atraumatic Eyes: conjunctivae/corneas clear. PERRL, EOM's intact. Fundi benign. Ears: normal TM's and external ear canals both ears Nose: Nares normal. Septum midline. Mucosa normal. No drainage or sinus tenderness. Throat: lips, mucosa, and tongue normal; teeth and gums normal Neck: no adenopathy, no carotid bruit, no JVD, supple, symmetrical, trachea midline and thyroid not enlarged, symmetric, no tenderness/mass/nodules Back: symmetric, no curvature. ROM normal. No CVA tenderness. Lungs: clear to auscultation bilaterally Breasts: normal appearance, no masses or tenderness Heart: regular rate and rhythm, S1, S2 normal, no murmur, click, rub or gallop Abdomen: soft, non-tender; bowel sounds normal; no masses,  no organomegaly Pelvic: deferred Extremities: extremities normal, atraumatic, no cyanosis or edema Pulses: 2+ and symmetric Skin: Skin color, texture, turgor normal. No rashes or lesions Lymph nodes: Cervical, supraclavicular, and axillary nodes normal. Neurologic: Alert and oriented X 3, normal strength and tone. Normal symmetric reflexes. Normal coordination and gait    Assessment:    Healthy female exam.      Plan:     ghm utd Check labs See After Visit Summary for Counseling Recommendations    1. Hypothyroidism, unspecified type con't meds  - CBC with Differential/Platelet - Lipid panel - Comprehensive metabolic panel - POCT Urinalysis Dipstick (Automated) - TSH  2. Hyperlipidemia LDL goal <100 Check labs  con't meds - CBC with Differential/Platelet - Lipid panel - Comprehensive metabolic panel - POCT Urinalysis Dipstick (Automated) - TSH  3. Hematuria, unspecified type  - Urine Culture

## 2017-01-26 LAB — COMPREHENSIVE METABOLIC PANEL
ALK PHOS: 80 U/L (ref 33–130)
ALT: 17 U/L (ref 6–29)
AST: 17 U/L (ref 10–35)
Albumin: 4.3 g/dL (ref 3.6–5.1)
BILIRUBIN TOTAL: 0.6 mg/dL (ref 0.2–1.2)
BUN: 14 mg/dL (ref 7–25)
CO2: 23 mmol/L (ref 20–32)
CREATININE: 0.87 mg/dL (ref 0.60–0.93)
Calcium: 9.7 mg/dL (ref 8.6–10.4)
Chloride: 108 mmol/L (ref 98–110)
GLUCOSE: 90 mg/dL (ref 65–99)
Potassium: 4.7 mmol/L (ref 3.5–5.3)
SODIUM: 143 mmol/L (ref 135–146)
TOTAL PROTEIN: 6.7 g/dL (ref 6.1–8.1)

## 2017-01-26 LAB — TSH: TSH: 0.45 mIU/L

## 2017-01-26 LAB — LIPID PANEL
Cholesterol: 153 mg/dL (ref <200)
HDL: 69 mg/dL (ref >50)
LDL CALC: 65 mg/dL (ref <100)
TRIGLYCERIDES: 94 mg/dL (ref <150)
Total CHOL/HDL Ratio: 2.2 Ratio (ref <5.0)
VLDL: 19 mg/dL (ref <30)

## 2017-01-26 LAB — URINE CULTURE

## 2017-01-28 ENCOUNTER — Other Ambulatory Visit: Payer: Self-pay | Admitting: Family Medicine

## 2017-01-28 DIAGNOSIS — E785 Hyperlipidemia, unspecified: Secondary | ICD-10-CM

## 2017-02-25 ENCOUNTER — Telehealth: Payer: Self-pay | Admitting: Family Medicine

## 2017-02-25 NOTE — Telephone Encounter (Signed)
Spoke with pt regarding AWV. Pt stated that [facility] nurse came to her home on 02/22/17 and completed the annual wellness. Asked pt if she could bring a copy of her paper work to be scanned in the system for documentation. Pt stated that she would bring documents when she comes for her flu shot in October. SF

## 2017-03-12 ENCOUNTER — Ambulatory Visit (INDEPENDENT_AMBULATORY_CARE_PROVIDER_SITE_OTHER): Payer: Medicare Other

## 2017-03-12 ENCOUNTER — Telehealth: Payer: Self-pay | Admitting: Family Medicine

## 2017-03-12 DIAGNOSIS — Z23 Encounter for immunization: Secondary | ICD-10-CM | POA: Diagnosis not present

## 2017-03-12 NOTE — Telephone Encounter (Signed)
Pt dropped off medical checklist from her health ins for Carollee Herter to check off.

## 2017-03-14 NOTE — Telephone Encounter (Signed)
Paperwork forwarded to provider/SLS 10/11

## 2017-05-20 DIAGNOSIS — H52223 Regular astigmatism, bilateral: Secondary | ICD-10-CM | POA: Diagnosis not present

## 2017-05-27 ENCOUNTER — Other Ambulatory Visit: Payer: Self-pay | Admitting: Family Medicine

## 2017-05-27 DIAGNOSIS — E039 Hypothyroidism, unspecified: Secondary | ICD-10-CM

## 2017-05-27 DIAGNOSIS — E785 Hyperlipidemia, unspecified: Secondary | ICD-10-CM

## 2017-07-31 ENCOUNTER — Other Ambulatory Visit (INDEPENDENT_AMBULATORY_CARE_PROVIDER_SITE_OTHER): Payer: Medicare Other

## 2017-07-31 DIAGNOSIS — E785 Hyperlipidemia, unspecified: Secondary | ICD-10-CM

## 2017-07-31 LAB — COMPREHENSIVE METABOLIC PANEL
ALT: 15 U/L (ref 0–35)
AST: 16 U/L (ref 0–37)
Albumin: 4.1 g/dL (ref 3.5–5.2)
Alkaline Phosphatase: 89 U/L (ref 39–117)
BUN: 14 mg/dL (ref 6–23)
CALCIUM: 10.2 mg/dL (ref 8.4–10.5)
CHLORIDE: 106 meq/L (ref 96–112)
CO2: 29 meq/L (ref 19–32)
Creatinine, Ser: 0.93 mg/dL (ref 0.40–1.20)
GFR: 61.6 mL/min (ref >60.00)
Glucose, Bld: 99 mg/dL (ref 70–99)
POTASSIUM: 5.4 meq/L — AB (ref 3.5–5.1)
SODIUM: 142 meq/L (ref 135–145)
Total Bilirubin: 0.6 mg/dL (ref 0.2–1.2)
Total Protein: 7.3 g/dL (ref 6.0–8.3)

## 2017-07-31 LAB — LIPID PANEL
CHOL/HDL RATIO: 2
CHOLESTEROL: 152 mg/dL (ref 0–200)
HDL: 60.9 mg/dL (ref >39.00)
LDL CALC: 65 mg/dL (ref 0–99)
NonHDL: 91.03
TRIGLYCERIDES: 132 mg/dL (ref 0.0–149.0)
VLDL: 26.4 mg/dL (ref 0.0–40.0)

## 2017-08-05 ENCOUNTER — Other Ambulatory Visit: Payer: Self-pay | Admitting: *Deleted

## 2017-08-05 ENCOUNTER — Encounter: Payer: Self-pay | Admitting: *Deleted

## 2017-08-05 DIAGNOSIS — E875 Hyperkalemia: Secondary | ICD-10-CM

## 2017-08-15 ENCOUNTER — Other Ambulatory Visit (INDEPENDENT_AMBULATORY_CARE_PROVIDER_SITE_OTHER): Payer: Medicare Other

## 2017-08-15 DIAGNOSIS — E875 Hyperkalemia: Secondary | ICD-10-CM | POA: Diagnosis not present

## 2017-08-15 LAB — COMPREHENSIVE METABOLIC PANEL
ALK PHOS: 88 U/L (ref 39–117)
ALT: 17 U/L (ref 0–35)
AST: 16 U/L (ref 0–37)
Albumin: 4.1 g/dL (ref 3.5–5.2)
BUN: 13 mg/dL (ref 6–23)
CO2: 29 mEq/L (ref 19–32)
CREATININE: 0.96 mg/dL (ref 0.40–1.20)
Calcium: 9.8 mg/dL (ref 8.4–10.5)
Chloride: 107 mEq/L (ref 96–112)
GFR: 59.38 mL/min — ABNORMAL LOW (ref >60.00)
GLUCOSE: 106 mg/dL — AB (ref 70–99)
Potassium: 4.5 mEq/L (ref 3.5–5.1)
SODIUM: 141 meq/L (ref 135–145)
TOTAL PROTEIN: 6.9 g/dL (ref 6.0–8.3)
Total Bilirubin: 0.5 mg/dL (ref 0.2–1.2)

## 2017-08-21 ENCOUNTER — Encounter: Payer: Self-pay | Admitting: *Deleted

## 2017-12-20 ENCOUNTER — Other Ambulatory Visit: Payer: Self-pay | Admitting: Family Medicine

## 2017-12-20 DIAGNOSIS — E785 Hyperlipidemia, unspecified: Secondary | ICD-10-CM

## 2017-12-20 DIAGNOSIS — E039 Hypothyroidism, unspecified: Secondary | ICD-10-CM

## 2017-12-23 NOTE — Telephone Encounter (Signed)
Med refill request, orders pended for review, as last OV 01/2017, next scheduled OV 03/07/18.

## 2018-01-20 ENCOUNTER — Other Ambulatory Visit: Payer: Self-pay | Admitting: Family Medicine

## 2018-01-20 DIAGNOSIS — Z1231 Encounter for screening mammogram for malignant neoplasm of breast: Secondary | ICD-10-CM

## 2018-02-10 ENCOUNTER — Ambulatory Visit (HOSPITAL_BASED_OUTPATIENT_CLINIC_OR_DEPARTMENT_OTHER)
Admission: RE | Admit: 2018-02-10 | Discharge: 2018-02-10 | Disposition: A | Discharge status: Home / Self Care | Payer: Medicare Other | Source: Ambulatory Visit | Attending: Family Medicine | Admitting: Family Medicine

## 2018-02-10 ENCOUNTER — Encounter (HOSPITAL_BASED_OUTPATIENT_CLINIC_OR_DEPARTMENT_OTHER): Payer: Self-pay

## 2018-02-10 DIAGNOSIS — Z1231 Encounter for screening mammogram for malignant neoplasm of breast: Secondary | ICD-10-CM | POA: Insufficient documentation

## 2018-03-07 ENCOUNTER — Encounter: Payer: Medicare Other | Admitting: Family Medicine

## 2018-03-24 ENCOUNTER — Ambulatory Visit (INDEPENDENT_AMBULATORY_CARE_PROVIDER_SITE_OTHER): Payer: Medicare Other

## 2018-03-24 DIAGNOSIS — Z23 Encounter for immunization: Secondary | ICD-10-CM | POA: Diagnosis not present

## 2018-05-20 ENCOUNTER — Encounter: Payer: Self-pay | Admitting: Family Medicine

## 2018-05-20 ENCOUNTER — Ambulatory Visit (INDEPENDENT_AMBULATORY_CARE_PROVIDER_SITE_OTHER): Payer: Medicare Other | Admitting: Family Medicine

## 2018-05-20 VITALS — BP 138/60 | HR 89 | Temp 98.0°F | Resp 16 | Ht 64.5 in | Wt 206.4 lb

## 2018-05-20 DIAGNOSIS — Z0000 Encounter for general adult medical examination without abnormal findings: Secondary | ICD-10-CM | POA: Diagnosis not present

## 2018-05-20 DIAGNOSIS — E785 Hyperlipidemia, unspecified: Secondary | ICD-10-CM | POA: Diagnosis not present

## 2018-05-20 DIAGNOSIS — E039 Hypothyroidism, unspecified: Secondary | ICD-10-CM | POA: Diagnosis not present

## 2018-05-20 DIAGNOSIS — I10 Essential (primary) hypertension: Secondary | ICD-10-CM | POA: Diagnosis not present

## 2018-05-20 NOTE — Patient Instructions (Signed)
Preventive Care 65 Years and Older, Female Preventive care refers to lifestyle choices and visits with your health care provider that can promote health and wellness. What does preventive care include?  A yearly physical exam. This is also called an annual well check.  Dental exams once or twice a year.  Routine eye exams. Ask your health care provider how often you should have your eyes checked.  Personal lifestyle choices, including: ? Daily care of your teeth and gums. ? Regular physical activity. ? Eating a healthy diet. ? Avoiding tobacco and drug use. ? Limiting alcohol use. ? Practicing safe sex. ? Taking low-dose aspirin every day. ? Taking vitamin and mineral supplements as recommended by your health care provider. What happens during an annual well check? The services and screenings done by your health care provider during your annual well check will depend on your age, overall health, lifestyle risk factors, and family history of disease. Counseling Your health care provider may ask you questions about your:  Alcohol use.  Tobacco use.  Drug use.  Emotional well-being.  Home and relationship well-being.  Sexual activity.  Eating habits.  History of falls.  Memory and ability to understand (cognition).  Work and work environment.  Reproductive health.  Screening You may have the following tests or measurements:  Height, weight, and BMI.  Blood pressure.  Lipid and cholesterol levels. These may be checked every 5 years, or more frequently if you are over 50 years old.  Skin check.  Lung cancer screening. You may have this screening every year starting at age 55 if you have a 30-pack-year history of smoking and currently smoke or have quit within the past 15 years.  Fecal occult blood test (FOBT) of the stool. You may have this test every year starting at age 50.  Flexible sigmoidoscopy or colonoscopy. You may have a sigmoidoscopy every 5 years or  a colonoscopy every 10 years starting at age 50.  Hepatitis C blood test.  Hepatitis B blood test.  Sexually transmitted disease (STD) testing.  Diabetes screening. This is done by checking your blood sugar (glucose) after you have not eaten for a while (fasting). You may have this done every 1-3 years.  Bone density scan. This is done to screen for osteoporosis. You may have this done starting at age 81.  Mammogram. This may be done every 1-2 years. Talk to your health care provider about how often you should have regular mammograms.  Talk with your health care provider about your test results, treatment options, and if necessary, the need for more tests. Vaccines Your health care provider may recommend certain vaccines, such as:  Influenza vaccine. This is recommended every year.  Tetanus, diphtheria, and acellular pertussis (Tdap, Td) vaccine. You may need a Td booster every 10 years.  Varicella vaccine. You may need this if you have not been vaccinated.  Zoster vaccine. You may need this after age 60.  Measles, mumps, and rubella (MMR) vaccine. You may need at least one dose of MMR if you were born in 1957 or later. You may also need a second dose.  Pneumococcal 13-valent conjugate (PCV13) vaccine. One dose is recommended after age 81.  Pneumococcal polysaccharide (PPSV23) vaccine. One dose is recommended after age 81.  Meningococcal vaccine. You may need this if you have certain conditions.  Hepatitis A vaccine. You may need this if you have certain conditions or if you travel or work in places where you may be exposed to hepatitis   A.  Hepatitis B vaccine. You may need this if you have certain conditions or if you travel or work in places where you may be exposed to hepatitis B.  Haemophilus influenzae type b (Hib) vaccine. You may need this if you have certain conditions.  Talk to your health care provider about which screenings and vaccines you need and how often you  need them. This information is not intended to replace advice given to you by your health care provider. Make sure you discuss any questions you have with your health care provider. Document Released: 06/17/2015 Document Revised: 02/08/2016 Document Reviewed: 03/22/2015 Elsevier Interactive Patient Education  2018 [employer]  

## 2018-05-20 NOTE — Progress Notes (Signed)
Patient ID: Carla Reilly, female    DOB: 1934/12/26  Age: 83 y.o. MRN: [id number]    Subjective:  Subjective  HPI [place] presents for cpe and labs.  No complaints.    Review of Systems  Constitutional: Negative for appetite change, chills, diaphoresis, fatigue, fever and unexpected weight change.  HENT: Negative for congestion and hearing loss.   Eyes: Negative for pain, discharge, redness and visual disturbance.  Respiratory: Negative for cough, chest tightness, shortness of breath and wheezing.   Cardiovascular: Negative for chest pain, palpitations and leg swelling.  Gastrointestinal: Negative for abdominal pain, blood in stool, constipation, diarrhea, nausea and vomiting.  Endocrine: Negative for cold intolerance, heat intolerance, polydipsia, polyphagia and polyuria.  Genitourinary: Negative for difficulty urinating, dysuria, frequency, hematuria and urgency.  Musculoskeletal: Negative for back pain and myalgias.  Skin: Negative for rash.  Allergic/Immunologic: Negative for environmental allergies.  Neurological: Negative for dizziness, weakness, light-headedness, numbness and headaches.  Hematological: Does not bruise/bleed easily.  Psychiatric/Behavioral: Negative for suicidal ideas. The patient is not nervous/anxious.     History Past Medical History:  Diagnosis Date  . Carpal tunnel syndrome   . Cataracts, bilateral   . Hyperlipidemia   . Thyroid disease    Hypothyroidism  . Wears dentures   . Wears glasses     She has a past surgical history that includes Tonsillectomy; Dilation and curettage of uterus (1980); Hysteroscopy w/D&C (N/A, 03/16/2015); Total abdominal hysterectomy w/ bilateral salpingoophorectomy (Bilateral, 07/2015); and CATARACTS REMOVED OCT & NOV 2017.   Her family history includes Cancer in her father; Colon cancer (age of onset: 27) in her son; Dementia in her mother; Hypertension in her brother; Hypothyroidism in her mother; Prostate cancer in  her father; Testicular cancer in her grandchild.She reports that she quit smoking about 45 years ago. She has never used smokeless tobacco. She reports that she does not drink alcohol or use drugs.  Current Outpatient Medications on File Prior to Visit  Medication Sig Dispense Refill  . Coenzyme Q10 (VITALINE COQ10 PO) Take 600 mg by mouth.    . EPINEPHrine 0.3 mg/0.3 mL IJ SOAJ injection Inject 0.3 mLs (0.3 mg total) into the muscle once. 3 Device 0  . levothyroxine (SYNTHROID, LEVOTHROID) 100 MCG tablet TAKE 1 TABLET BY MOUTH  DAILY 90 tablet 1  . Lutein 40 MG CAPS Take 1 capsule by mouth daily.    . simvastatin (ZOCOR) 40 MG tablet TAKE 1 TABLET BY MOUTH AT  BEDTIME 90 tablet 1  . Specialty Vitamins Products (RA EAR CARE) TABS Take by mouth as needed.      No current facility-administered medications on file prior to visit.      Objective:  Objective  Physical Exam Vitals signs and nursing note reviewed.  Constitutional:      General: She is not in acute distress.    Appearance: She is well-developed. She is not diaphoretic.  HENT:     Head: Normocephalic and atraumatic.     Right Ear: External ear normal.     Left Ear: External ear normal.     Nose: Nose normal.  Eyes:     General:        Right eye: No discharge.        Left eye: No discharge.     Conjunctiva/sclera: Conjunctivae normal.     Pupils: Pupils are equal, round, and reactive to light.  Neck:     Musculoskeletal: Normal range of motion and neck supple.  Thyroid: No thyromegaly.     Vascular: No JVD.  Cardiovascular:     Rate and Rhythm: Normal rate and regular rhythm.     Heart sounds: Normal heart sounds. No murmur.  Pulmonary:     Effort: Pulmonary effort is normal. No respiratory distress.     Breath sounds: Normal breath sounds. No wheezing or rales.  Chest:     Chest wall: No tenderness.  Abdominal:     General: Bowel sounds are normal. There is no distension.     Palpations: Abdomen is soft. There  is no mass.     Tenderness: There is no abdominal tenderness. There is no guarding or rebound.  Genitourinary:    Vagina: Normal. No vaginal discharge.     Rectum: Guaiac result negative.  Musculoskeletal: Normal range of motion.        General: No tenderness.  Lymphadenopathy:     Cervical: No cervical adenopathy.  Skin:    General: Skin is warm and dry.     Findings: No erythema or rash.  Neurological:     Mental Status: She is alert and oriented to person, place, and time.     Cranial Nerves: No cranial nerve deficit.     Deep Tendon Reflexes: Reflexes are normal and symmetric.  Psychiatric:        Behavior: Behavior normal.        Thought Content: Thought content normal.        Judgment: Judgment normal.    BP 138/60 (BP Location: Left Arm, Cuff Size: Large)   Pulse 89   Temp 98 F (36.7 C) (Oral)   Resp 16   Ht 5' 4.5" (1.638 m)   Wt 206 lb 6.4 oz (93.6 kg)   SpO2 95%   BMI 34.88 kg/m  Wt Readings from Last 3 Encounters:  05/20/18 206 lb 6.4 oz (93.6 kg)  01/25/17 200 lb (90.7 kg)  12/27/15 203 lb 6.4 oz (92.3 kg)     Lab Results  Component Value Date   WBC 7.5 01/25/2017   HGB 14.2 01/25/2017   HCT 43.0 01/25/2017   PLT 218 01/25/2017   GLUCOSE 106 (H) 08/15/2017   CHOL 152 07/31/2017   TRIG 132.0 07/31/2017   HDL 60.90 07/31/2017   LDLCALC 65 07/31/2017   ALT 17 08/15/2017   AST 16 08/15/2017   NA 141 08/15/2017   K 4.5 08/15/2017   CL 107 08/15/2017   CREATININE 0.96 08/15/2017   BUN 13 08/15/2017   CO2 29 08/15/2017   TSH 0.45 01/25/2017    Mm 3d Screen Breast Bilateral  Result Date: 02/10/2018 CLINICAL DATA:  Screening. EXAM: DIGITAL SCREENING BILATERAL MAMMOGRAM WITH TOMO AND CAD COMPARISON:  Previous exam(s). ACR Breast Density Category b: There are scattered areas of fibroglandular density. FINDINGS: There are no findings suspicious for malignancy. Images were processed with CAD. IMPRESSION: No mammographic evidence of malignancy. A result  letter of this screening mammogram will be mailed directly to the patient. RECOMMENDATION: Screening mammogram in one year. (Code:SM-B-01Y) BI-RADS CATEGORY  1: Negative. Electronically Signed   By: Lovey Newcomer M.D.   On: 02/10/2018 14:45     Assessment & Plan:  Plan  I have discontinued Nadalie Vanderwall's multivitamin and CHROMIUM PO. I am also having her maintain her Coenzyme Q10 (VITALINE COQ10 PO), Lutein, RA EAR CARE, EPINEPHrine, simvastatin, and levothyroxine.  No orders of the defined types were placed in this encounter.   Problem List Items Addressed This Visit  Unprioritized   Essential hypertension    Well controlled, no changes to meds. Encouraged heart healthy diet such as the DASH diet and exercise as tolerated.       Relevant Orders   TSH   Lipid panel   CBC with Differential/Platelet   Comprehensive metabolic panel   Hyperlipidemia    Encouraged heart healthy diet, increase exercise, avoid trans fats, consider a krill oil cap daily      Relevant Orders   TSH   Lipid panel   CBC with Differential/Platelet   Comprehensive metabolic panel   Hypothyroidism   Relevant Orders   TSH   Lipid panel   CBC with Differential/Platelet   Comprehensive metabolic panel   Preventative health care - Primary    See avs Check labs  ghm utd          Follow-up: Return in about 6 months (around 11/19/2018), or if symptoms worsen or fail to improve, for hypertension, hyperlipidemia.  Ann Held, DO

## 2018-05-20 NOTE — Assessment & Plan Note (Signed)
Well controlled, no changes to meds. Encouraged heart healthy diet such as the DASH diet and exercise as tolerated.  °

## 2018-05-20 NOTE — Assessment & Plan Note (Signed)
Encouraged heart healthy diet, increase exercise, avoid trans fats, consider a krill oil cap daily 

## 2018-05-20 NOTE — Assessment & Plan Note (Signed)
See avs Check labs ghm utd 

## 2018-05-20 NOTE — Assessment & Plan Note (Signed)
Check labs 

## 2018-05-21 LAB — CBC WITH DIFFERENTIAL/PLATELET
BASOS PCT: 0.7 % (ref 0.0–3.0)
Basophils Absolute: 0 10*3/uL (ref 0.0–0.1)
EOS PCT: 3.1 % (ref 0.0–5.0)
Eosinophils Absolute: 0.2 10*3/uL (ref 0.0–0.7)
HCT: 42.6 % (ref 36.0–46.0)
HEMOGLOBIN: 14.4 g/dL (ref 12.0–15.0)
LYMPHS PCT: 41 % (ref 12.0–46.0)
Lymphs Abs: 2.4 10*3/uL (ref 0.7–4.0)
MCHC: 33.8 g/dL (ref 30.0–36.0)
MCV: 93.6 fl (ref 78.0–100.0)
MONOS PCT: 9.6 % (ref 3.0–12.0)
Monocytes Absolute: 0.6 10*3/uL (ref 0.1–1.0)
NEUTROS ABS: 2.7 10*3/uL (ref 1.4–7.7)
Neutrophils Relative %: 45.6 % (ref 43.0–77.0)
PLATELETS: 232 10*3/uL (ref 150.0–400.0)
RBC: 4.55 Mil/uL (ref 3.87–5.11)
RDW: 13.5 % (ref 11.5–15.5)
WBC: 5.9 10*3/uL (ref 4.0–10.5)

## 2018-05-21 LAB — LIPID PANEL
CHOLESTEROL: 156 mg/dL (ref 0–200)
HDL: 64.7 mg/dL (ref >39.00)
LDL Cholesterol: 73 mg/dL (ref 0–99)
NonHDL: 91.44
TRIGLYCERIDES: 93 mg/dL (ref 0.0–149.0)
Total CHOL/HDL Ratio: 2
VLDL: 18.6 mg/dL (ref 0.0–40.0)

## 2018-05-21 LAB — COMPREHENSIVE METABOLIC PANEL
ALT: 14 U/L (ref 0–35)
AST: 14 U/L (ref 0–37)
Albumin: 4.3 g/dL (ref 3.5–5.2)
Alkaline Phosphatase: 98 U/L (ref 39–117)
BILIRUBIN TOTAL: 0.5 mg/dL (ref 0.2–1.2)
BUN: 14 mg/dL (ref 6–23)
CO2: 28 meq/L (ref 19–32)
Calcium: 10.1 mg/dL (ref 8.4–10.5)
Chloride: 105 mEq/L (ref 96–112)
Creatinine, Ser: 0.95 mg/dL (ref 0.40–1.20)
GFR: 59.99 mL/min — ABNORMAL LOW (ref >60.00)
Glucose, Bld: 97 mg/dL (ref 70–99)
Potassium: 5.3 mEq/L — ABNORMAL HIGH (ref 3.5–5.1)
Sodium: 141 mEq/L (ref 135–145)
Total Protein: 6.9 g/dL (ref 6.0–8.3)

## 2018-05-21 LAB — TSH: TSH: 0.58 u[IU]/mL (ref 0.35–4.50)

## 2018-05-23 NOTE — Telephone Encounter (Signed)
error 

## 2018-05-24 DIAGNOSIS — Z961 Presence of intraocular lens: Secondary | ICD-10-CM | POA: Diagnosis not present

## 2018-05-29 ENCOUNTER — Encounter: Payer: Self-pay | Admitting: *Deleted

## 2018-06-25 ENCOUNTER — Other Ambulatory Visit: Payer: Self-pay | Admitting: Family Medicine

## 2018-06-25 DIAGNOSIS — E785 Hyperlipidemia, unspecified: Secondary | ICD-10-CM

## 2018-06-25 DIAGNOSIS — E039 Hypothyroidism, unspecified: Secondary | ICD-10-CM

## 2018-07-01 DIAGNOSIS — H26491 Other secondary cataract, right eye: Secondary | ICD-10-CM | POA: Diagnosis not present

## 2018-07-01 DIAGNOSIS — H26492 Other secondary cataract, left eye: Secondary | ICD-10-CM | POA: Diagnosis not present

## 2018-07-11 DIAGNOSIS — H26491 Other secondary cataract, right eye: Secondary | ICD-10-CM | POA: Diagnosis not present

## 2018-12-18 ENCOUNTER — Other Ambulatory Visit: Payer: Self-pay | Admitting: Family Medicine

## 2018-12-18 DIAGNOSIS — E785 Hyperlipidemia, unspecified: Secondary | ICD-10-CM

## 2018-12-18 DIAGNOSIS — E039 Hypothyroidism, unspecified: Secondary | ICD-10-CM

## 2019-03-05 ENCOUNTER — Other Ambulatory Visit: Payer: Self-pay | Admitting: Family Medicine

## 2019-03-05 DIAGNOSIS — E785 Hyperlipidemia, unspecified: Secondary | ICD-10-CM

## 2019-03-05 DIAGNOSIS — E039 Hypothyroidism, unspecified: Secondary | ICD-10-CM

## 2019-03-13 ENCOUNTER — Ambulatory Visit (INDEPENDENT_AMBULATORY_CARE_PROVIDER_SITE_OTHER): Payer: Medicare Other

## 2019-03-13 ENCOUNTER — Other Ambulatory Visit: Payer: Self-pay

## 2019-03-13 DIAGNOSIS — Z23 Encounter for immunization: Secondary | ICD-10-CM

## 2019-03-30 ENCOUNTER — Telehealth: Payer: Self-pay

## 2019-03-30 ENCOUNTER — Other Ambulatory Visit (HOSPITAL_BASED_OUTPATIENT_CLINIC_OR_DEPARTMENT_OTHER): Payer: Self-pay | Admitting: Family Medicine

## 2019-03-30 ENCOUNTER — Other Ambulatory Visit: Payer: Self-pay | Admitting: Family Medicine

## 2019-03-30 DIAGNOSIS — Z1231 Encounter for screening mammogram for malignant neoplasm of breast: Secondary | ICD-10-CM

## 2019-03-30 DIAGNOSIS — I739 Peripheral vascular disease, unspecified: Secondary | ICD-10-CM

## 2019-03-30 NOTE — Telephone Encounter (Signed)
Copied from [place] [phone]. Topic: General - Inquiry >> Mar 27, 2019  3:35 PM Virl Axe D wrote: Reason for CRM: Starling Manns, NP with [employer] did a PAD screening and pt's left leg showed a moderate reading. Please advise

## 2019-03-30 NOTE — Telephone Encounter (Signed)
Arterial US ordered

## 2019-03-31 ENCOUNTER — Ambulatory Visit (HOSPITAL_BASED_OUTPATIENT_CLINIC_OR_DEPARTMENT_OTHER)
Admission: RE | Admit: 2019-03-31 | Discharge: 2019-03-31 | Disposition: A | Discharge status: Home / Self Care | Payer: Medicare Other | Source: Ambulatory Visit | Attending: Family Medicine | Admitting: Family Medicine

## 2019-03-31 ENCOUNTER — Ambulatory Visit (HOSPITAL_BASED_OUTPATIENT_CLINIC_OR_DEPARTMENT_OTHER): Payer: Medicare Other

## 2019-03-31 ENCOUNTER — Encounter (HOSPITAL_BASED_OUTPATIENT_CLINIC_OR_DEPARTMENT_OTHER): Payer: Self-pay

## 2019-03-31 ENCOUNTER — Other Ambulatory Visit: Payer: Self-pay

## 2019-03-31 DIAGNOSIS — Z1231 Encounter for screening mammogram for malignant neoplasm of breast: Secondary | ICD-10-CM | POA: Insufficient documentation

## 2019-03-31 DIAGNOSIS — I739 Peripheral vascular disease, unspecified: Secondary | ICD-10-CM | POA: Diagnosis not present

## 2019-03-31 NOTE — Progress Notes (Signed)
ABI completed results are located under CV Procedure

## 2019-03-31 NOTE — Telephone Encounter (Signed)
We need to do an arterial doppler/ ABI

## 2019-06-04 ENCOUNTER — Other Ambulatory Visit: Payer: Self-pay

## 2019-06-08 ENCOUNTER — Other Ambulatory Visit: Payer: Self-pay

## 2019-06-08 ENCOUNTER — Encounter: Payer: Self-pay | Admitting: Family Medicine

## 2019-06-08 ENCOUNTER — Ambulatory Visit (INDEPENDENT_AMBULATORY_CARE_PROVIDER_SITE_OTHER): Payer: Medicare Other | Admitting: Family Medicine

## 2019-06-08 VITALS — BP 150/70 | HR 93 | Temp 97.8°F | Resp 18 | Ht 64.5 in | Wt 207.2 lb

## 2019-06-08 DIAGNOSIS — E1169 Type 2 diabetes mellitus with other specified complication: Secondary | ICD-10-CM | POA: Insufficient documentation

## 2019-06-08 DIAGNOSIS — Z0000 Encounter for general adult medical examination without abnormal findings: Secondary | ICD-10-CM

## 2019-06-08 DIAGNOSIS — I10 Essential (primary) hypertension: Secondary | ICD-10-CM | POA: Diagnosis not present

## 2019-06-08 DIAGNOSIS — E785 Hyperlipidemia, unspecified: Secondary | ICD-10-CM

## 2019-06-08 DIAGNOSIS — E039 Hypothyroidism, unspecified: Secondary | ICD-10-CM | POA: Diagnosis not present

## 2019-06-08 LAB — LIPID PANEL
Cholesterol: 162 mg/dL (ref 0–200)
HDL: 63.9 mg/dL (ref >39.00)
LDL Cholesterol: 71 mg/dL (ref 0–99)
NonHDL: 98.25
Total CHOL/HDL Ratio: 3
Triglycerides: 134 mg/dL (ref 0.0–149.0)
VLDL: 26.8 mg/dL (ref 0.0–40.0)

## 2019-06-08 LAB — COMPREHENSIVE METABOLIC PANEL
ALT: 19 U/L (ref 0–35)
AST: 17 U/L (ref 0–37)
Albumin: 4.3 g/dL (ref 3.5–5.2)
Alkaline Phosphatase: 100 U/L (ref 39–117)
BUN: 15 mg/dL (ref 6–23)
CO2: 28 mEq/L (ref 19–32)
Calcium: 9.7 mg/dL (ref 8.4–10.5)
Chloride: 104 mEq/L (ref 96–112)
Creatinine, Ser: 0.88 mg/dL (ref 0.40–1.20)
GFR: 61.49 mL/min (ref >60.00)
Glucose, Bld: 104 mg/dL — ABNORMAL HIGH (ref 70–99)
Potassium: 4.5 mEq/L (ref 3.5–5.1)
Sodium: 142 mEq/L (ref 135–145)
Total Bilirubin: 0.6 mg/dL (ref 0.2–1.2)
Total Protein: 6.9 g/dL (ref 6.0–8.3)

## 2019-06-08 LAB — TSH: TSH: 1.01 u[IU]/mL (ref 0.35–4.50)

## 2019-06-08 MED ORDER — HYDROCHLOROTHIAZIDE 25 MG PO TABS: 25.0000 mg | ORAL_TABLET | Freq: Every day | ORAL | 2 refills | Status: DC

## 2019-06-08 NOTE — Assessment & Plan Note (Signed)
Check labs con't meds 

## 2019-06-08 NOTE — Patient Instructions (Addendum)
Preventive Care 20 Years and Older, Female Preventive care refers to lifestyle choices and visits with your health care provider that can promote health and wellness. This includes:  A yearly physical exam. This is also called an annual well check.  Regular dental and eye exams.  Immunizations.  Screening for certain conditions.  Healthy lifestyle choices, such as diet and exercise. What can I expect for my preventive care visit? Physical exam Your health care provider will check:  Height and weight. These may be used to calculate body mass index (BMI), which is a measurement that tells if you are at a healthy weight.  Heart rate and blood pressure.  Your skin for abnormal spots. Counseling Your health care provider may ask you questions about:  Alcohol, tobacco, and drug use.  Emotional well-being.  Home and relationship well-being.  Sexual activity.  Eating habits.  History of falls.  Memory and ability to understand (cognition).  Work and work Statistician.  Pregnancy and menstrual history. What immunizations do I need?  Influenza (flu) vaccine        This is recommended every year. Tetanus, diphtheria, and pertussis (Tdap) vaccine  You may need a Td booster every 10 years. Varicella (chickenpox) vaccine  You may need this vaccine if you have not already been vaccinated. Zoster (shingles) vaccine  You may need this after age 72. Pneumococcal conjugate (PCV13) vaccine  One dose is recommended after age 46. Pneumococcal polysaccharide (PPSV23) vaccine  One dose is recommended after age 27. Measles, mumps, and rubella (MMR) vaccine  You may need at least one dose of MMR if you were born in 1957 or later. You may also need a second dose. Meningococcal conjugate (MenACWY) vaccine  You may need this if you have certain conditions. Hepatitis A vaccine  You may need this if you have certain conditions or if you travel or work in places where you  may be exposed to hepatitis A. Hepatitis B vaccine  You may need this if you have certain conditions or if you travel or work in places where you may be exposed to hepatitis B. Haemophilus influenzae type b (Hib) vaccine  You may need this if you have certain conditions. You may receive vaccines as individual doses or as more than one vaccine together in one shot (combination vaccines). Talk with your health care provider about the risks and benefits of combination vaccines. What tests do I need? Blood tests  Lipid and cholesterol levels. These may be checked every 5 years, or more frequently depending on your overall health.  Hepatitis C test.  Hepatitis B test. Screening  Lung cancer screening. You may have this screening every year starting at age 51 if you have a 30-pack-year history of smoking and currently smoke or have quit within the past 15 years.  Colorectal cancer screening. All adults should have this screening starting at age 85 and continuing until age 76. Your health care provider may recommend screening at age 58 if you are at increased risk. You will have tests every 1-10 years, depending on your results and the type of screening test.  Diabetes screening. This is done by checking your blood sugar (glucose) after you have not eaten for a while (fasting). You may have this done every 1-3 years.  Mammogram. This may be done every 1-2 years. Talk with your health care provider about how often you should have regular mammograms.  BRCA-related cancer screening. This may be done if you have a family history of  breast, ovarian, tubal, or peritoneal cancers. Other tests  Sexually transmitted disease (STD) testing.  Bone density scan. This is done to screen for osteoporosis. You may have this done starting at age 32. Follow these instructions at home: Eating and drinking  Eat a diet that includes fresh fruits and vegetables, whole grains, lean protein, and low-fat dairy  products. Limit your intake of foods with high amounts of sugar, saturated fats, and salt.  Take vitamin and mineral supplements as recommended by your health care provider.  Do not drink alcohol if your health care provider tells you not to drink.  If you drink alcohol: ? Limit how much you have to 0-1 drink a day. ? Be aware of how much alcohol is in your drink. In the [place] one drink equals one 12 oz bottle of beer (355 mL), one 5 oz glass of wine (148 mL), or one 1 oz glass of hard liquor (44 mL). Lifestyle  Take daily care of your teeth and gums.  Stay active. Exercise for at least 30 minutes on 5 or more days each week.  Do not use any products that contain nicotine or tobacco, such as cigarettes, e-cigarettes, and chewing tobacco. If you need help quitting, ask your health care provider.  If you are sexually active, practice safe sex. Use a condom or other form of protection in order to prevent STIs (sexually transmitted infections).  Talk with your health care provider about taking a low-dose aspirin or statin. What's next?  Go to your health care provider once a year for a well check visit.  Ask your health care provider how often you should have your eyes and teeth checked.  Stay up to date on all vaccines. This information is not intended to replace advice given to you by your health care provider. Make sure you discuss any questions you have with your health care provider. Document Revised: 05/15/2018 Document Reviewed: 05/15/2018 Elsevier Patient Education  [place] DASH stands for "Dietary Approaches to Stop Hypertension." The DASH eating plan is a healthy eating plan that has been shown to reduce high blood pressure (hypertension). It may also reduce your risk for type 2 diabetes, heart disease, and stroke. The DASH eating plan may also help with weight loss. What are tips for following this plan?  General guidelines  Avoid eating  more than 2,300 mg (milligrams) of salt (sodium) a day. If you have hypertension, you may need to reduce your sodium intake to 1,500 mg a day.  Limit alcohol intake to no more than 1 drink a day for nonpregnant women and 2 drinks a day for men. One drink equals 12 oz of beer, 5 oz of wine, or 1 oz of hard liquor.  Work with your health care provider to maintain a healthy body weight or to lose weight. Ask what an ideal weight is for you.  Get at least 30 minutes of exercise that causes your heart to beat faster (aerobic exercise) most days of the week. Activities may include walking, swimming, or biking.  Work with your health care provider or diet and nutrition specialist (dietitian) to adjust your eating plan to your individual calorie needs. Reading food labels   Check food labels for the amount of sodium per serving. Choose foods with less than 5 percent of the Daily Value of sodium. Generally, foods with less than 300 mg of sodium per serving fit into this eating plan.  To find whole  grains, look for the word "whole" as the first word in the ingredient list. Shopping  Buy products labeled as "low-sodium" or "no salt added."  Buy fresh foods. Avoid canned foods and premade or frozen meals. Cooking  Avoid adding salt when cooking. Use salt-free seasonings or herbs instead of table salt or sea salt. Check with your health care provider or pharmacist before using salt substitutes.  Do not fry foods. Cook foods using healthy methods such as baking, boiling, grilling, and broiling instead.  Cook with heart-healthy oils, such as olive, canola, soybean, or sunflower oil. Meal planning  Eat a balanced diet that includes: ? 5 or more servings of fruits and vegetables each day. At each meal, try to fill half of your plate with fruits and vegetables. ? Up to 6-8 servings of whole grains each day. ? Less than 6 oz of lean meat, poultry, or fish each day. A 3-oz serving of meat is about the  same size as a deck of cards. One egg equals 1 oz. ? 2 servings of low-fat dairy each day. ? A serving of nuts, seeds, or beans 5 times each week. ? Heart-healthy fats. Healthy fats called Omega-3 fatty acids are found in foods such as flaxseeds and coldwater fish, like sardines, salmon, and mackerel.  Limit how much you eat of the following: ? Canned or prepackaged foods. ? Food that is high in trans fat, such as fried foods. ? Food that is high in saturated fat, such as fatty meat. ? Sweets, desserts, sugary drinks, and other foods with added sugar. ? Full-fat dairy products.  Do not salt foods before eating.  Try to eat at least 2 vegetarian meals each week.  Eat more home-cooked food and less restaurant, buffet, and fast food.  When eating at a restaurant, ask that your food be prepared with less salt or no salt, if possible. What foods are recommended? The items listed may not be a complete list. Talk with your dietitian about what dietary choices are best for you. Grains Whole-grain or whole-wheat bread. Whole-grain or whole-wheat pasta. Brown rice. [place]. Bulgur. Whole-grain and low-sodium cereals. Pita bread. Low-fat, low-sodium crackers. Whole-wheat flour tortillas. Vegetables Fresh or frozen vegetables (raw, steamed, roasted, or grilled). Low-sodium or reduced-sodium tomato and vegetable juice. Low-sodium or reduced-sodium tomato sauce and tomato paste. Low-sodium or reduced-sodium canned vegetables. Fruits All fresh, dried, or frozen fruit. Canned fruit in natural juice (without added sugar). Meat and other protein foods Skinless chicken or [place]. Ground chicken or [place]. Pork with fat trimmed off. Fish and seafood. Egg whites. Dried beans, peas, or lentils. Unsalted nuts, nut butters, and seeds. Unsalted canned beans. Lean cuts of beef with fat trimmed off. Low-sodium, lean deli meat. Dairy Low-fat (1%) or fat-free (skim) milk. Fat-free, low-fat, or reduced-fat  cheeses. Nonfat, low-sodium ricotta or cottage cheese. Low-fat or nonfat yogurt. Low-fat, low-sodium cheese. Fats and oils Soft margarine without trans fats. Vegetable oil. Low-fat, reduced-fat, or light mayonnaise and salad dressings (reduced-sodium). Canola, safflower, olive, soybean, and sunflower oils. Avocado. Seasoning and other foods Herbs. Spices. Seasoning mixes without salt. Unsalted popcorn and pretzels. Fat-free sweets. What foods are not recommended? The items listed may not be a complete list. Talk with your dietitian about what dietary choices are best for you. Grains Baked goods made with fat, such as croissants, muffins, or some breads. Dry pasta or rice meal packs. Vegetables Creamed or fried vegetables. Vegetables in a cheese sauce. Regular canned vegetables (not low-sodium or reduced-sodium).  Regular canned tomato sauce and paste (not low-sodium or reduced-sodium). Regular tomato and vegetable juice (not low-sodium or reduced-sodium). Angie Fava. Olives. Fruits Canned fruit in a light or heavy syrup. Fried fruit. Fruit in cream or butter sauce. Meat and other protein foods Fatty cuts of meat. Ribs. Fried meat. Berniece Salines. Sausage. Bologna and other processed lunch meats. Salami. Fatback. Hotdogs. Bratwurst. Salted nuts and seeds. Canned beans with added salt. Canned or smoked fish. Whole eggs or egg yolks. Chicken or [place] with skin. Dairy Whole or 2% milk, cream, and half-and-half. Whole or full-fat cream cheese. Whole-fat or sweetened yogurt. Full-fat cheese. Nondairy creamers. Whipped toppings. Processed cheese and cheese spreads. Fats and oils Butter. Stick margarine. Lard. Shortening. Ghee. Bacon fat. Tropical oils, such as coconut, palm kernel, or palm oil. Seasoning and other foods Salted popcorn and pretzels. Onion salt, garlic salt, seasoned salt, table salt, and sea salt. Worcestershire sauce. Tartar sauce. Barbecue sauce. Teriyaki sauce. Soy sauce, including  reduced-sodium. Steak sauce. Canned and packaged gravies. Fish sauce. Oyster sauce. Cocktail sauce. Horseradish that you find on the shelf. Ketchup. Mustard. Meat flavorings and tenderizers. Bouillon cubes. Hot sauce and Tabasco sauce. Premade or packaged marinades. Premade or packaged taco seasonings. Relishes. Regular salad dressings. Where to find more information:  National Heart, Lung, and [place]: https://wilson-eaton.com/  American Heart Association: www.heart.org Summary  The DASH eating plan is a healthy eating plan that has been shown to reduce high blood pressure (hypertension). It may also reduce your risk for type 2 diabetes, heart disease, and stroke.  With the DASH eating plan, you should limit salt (sodium) intake to 2,300 mg a day. If you have hypertension, you may need to reduce your sodium intake to 1,500 mg a day.  When on the DASH eating plan, aim to eat more fresh fruits and vegetables, whole grains, lean proteins, low-fat dairy, and heart-healthy fats.  Work with your health care provider or diet and nutrition specialist (dietitian) to adjust your eating plan to your individual calorie needs. This information is not intended to replace advice given to you by your health care provider. Make sure you discuss any questions you have with your health care provider. Document Revised: 05/03/2017 Document Reviewed: 05/14/2016 Elsevier Patient Education  2020 [employer].

## 2019-06-08 NOTE — Progress Notes (Signed)
Subjective:     Carla Reilly is a 85 y.o. female and is here for a comprehensive physical exam. The patient reports bp running high at home-- no cp, no sob or headaches.   Her son was recently dx with htn so she was curious and bought a bp cuff.    No other complaints  Social History   Socioeconomic History  . Marital status: Widowed    Spouse name: Not on file  . Number of children: 2  . Years of education: 47  . Highest education level: Not on file  Occupational History    Employer: [employer]    Comment: retired 12/2014  Tobacco Use  . Smoking status: Former Smoker    Quit date: 06/04/1972    Years since quitting: 47.0  . Smokeless tobacco: Never Used  Substance and Sexual Activity  . Alcohol use: No  . Drug use: No  . Sexual activity: Yes    Partners: Male  Other Topics Concern  . Not on file  Social History Narrative  . Not on file   Social Determinants of Health   Financial Resource Strain:   . Difficulty of Paying Living Expenses: Not on file  Food Insecurity:   . Worried About Charity fundraiser in the Last Year: Not on file  . Ran Out of Food in the Last Year: Not on file  Transportation Needs:   . Lack of Transportation (Medical): Not on file  . Lack of Transportation (Non-Medical): Not on file  Physical Activity:   . Days of Exercise per Week: Not on file  . Minutes of Exercise per Session: Not on file  Stress:   . Feeling of Stress : Not on file  Social Connections:   . Frequency of Communication with Friends and Family: Not on file  . Frequency of Social Gatherings with Friends and Family: Not on file  . Attends Religious Services: Not on file  . Active Member of Clubs or Organizations: Not on file  . Attends Archivist Meetings: Not on file  . Marital Status: Not on file  Intimate Partner Violence:   . Fear of Current or Ex-Partner: Not on file  . Emotionally Abused: Not on file  . Physically Abused: Not on file  . Sexually Abused: Not on  file   Health Maintenance  Topic Date Due  . HEMOGLOBIN A1C  08/13/36  . FOOT EXAM  [birth year]1947  . OPHTHALMOLOGY EXAM  04/05/1945  . URINE MICROALBUMIN  [birth year]05/1947  . MAMMOGRAM  03/30/2020  . TETANUS/TDAP  10/17/2023  . INFLUENZA VACCINE  Completed  . DEXA SCAN  Completed  . PNA vac Low Risk Adult  Completed    The following portions of the patient's history were reviewed and updated as appropriate: She  has a past medical history of Carpal tunnel syndrome, Cataracts, bilateral, Hyperlipidemia, Thyroid disease, Wears dentures, and Wears glasses. She does not have any pertinent problems on file. She  has a past surgical history that includes Tonsillectomy; Dilation and curettage of uterus (1980); Hysteroscopy with D & C (N/A, 03/16/2015); Total abdominal hysterectomy w/ bilateral salpingoophorectomy (Bilateral, 07/2015); and CATARACTS REMOVED OCT & NOV 2017. Her family history includes Cancer in her father; Colon cancer (age of onset: 67) in her son; Dementia in her mother; Hypertension in her brother; Hypothyroidism in her mother; Prostate cancer in her father; Testicular cancer in her grandchild. She  reports that she quit smoking about 47 years ago. She has never used smokeless  tobacco. She reports that she does not drink alcohol or use drugs. She has a current medication list which includes the following prescription(s): coenzyme q10, epinephrine, levothyroxine, lutein, simvastatin, ra ear care, and hydrochlorothiazide. Current Outpatient Medications on File Prior to Visit  Medication Sig Dispense Refill  . Coenzyme Q10 (VITALINE COQ10 PO) Take 600 mg by mouth.    . EPINEPHrine 0.3 mg/0.3 mL IJ SOAJ injection Inject 0.3 mLs (0.3 mg total) into the muscle once. 3 Device 0  . levothyroxine (SYNTHROID) 100 MCG tablet TAKE 1 TABLET BY MOUTH  DAILY 90 tablet 3  . Lutein 40 MG CAPS Take 1 capsule by mouth daily.    . simvastatin (ZOCOR) 40 MG tablet TAKE 1 TABLET BY MOUTH AT  BEDTIME 90  tablet 3  . Specialty Vitamins Products (RA EAR CARE) TABS Take by mouth as needed.      No current facility-administered medications on file prior to visit.   She is allergic to yellow jacket venom [bee venom]..  Review of Systems Review of Systems  Constitutional: Negative for activity change, appetite change and fatigue.  HENT: Negative for hearing loss, congestion, tinnitus and ear discharge.  dentist q27m Eyes: Negative for visual disturbance (see optho q1y -- vision corrected to 20/20 with glasses).  Respiratory: Negative for cough, chest tightness and shortness of breath.   Cardiovascular: Negative for chest pain, palpitations and leg swelling.  Gastrointestinal: Negative for abdominal pain, diarrhea, constipation and abdominal distention.  Genitourinary: Negative for urgency, frequency, decreased urine volume and difficulty urinating.  Musculoskeletal: Negative for back pain, arthralgias and gait problem.  Skin: Negative for color change, pallor and rash.  Neurological: Negative for dizziness, light-headedness, numbness and headaches.  Hematological: Negative for adenopathy. Does not bruise/bleed easily.  Psychiatric/Behavioral: Negative for suicidal ideas, confusion, sleep disturbance, self-injury, dysphoric mood, decreased concentration and agitation.       Objective:    BP (!) 150/70 (BP Location: Right Arm, Patient Position: Sitting, Cuff Size: Large)   Pulse 93   Temp 97.8 F (36.6 C) (Temporal)   Resp 18   Ht 5' 4.5" (1.638 m)   Wt 207 lb 3.2 oz (94 kg)   SpO2 100%   BMI 35.02 kg/m  General appearance: alert, cooperative, appears stated age and no distress Head: Normocephalic, without obvious abnormality, atraumatic Eyes: negative findings: lids and lashes normal, conjunctivae and sclerae normal and pupils equal, round, reactive to light and accomodation Ears: normal TM's and external ear canals both ears Neck: no adenopathy, no carotid bruit, no JVD, supple,  symmetrical, trachea midline and thyroid not enlarged, symmetric, no tenderness/mass/nodules Back: symmetric, no curvature. ROM normal. No CVA tenderness. Lungs: clear to auscultation bilaterally Breasts: normal appearance, no masses or tenderness Heart: regular rate and rhythm, S1, S2 normal, no murmur, click, rub or gallop Abdomen: soft, non-tender; bowel sounds normal; no masses,  no organomegaly Pelvic: not indicated; status post hysterectomy, negative ROS Extremities: extremities normal, atraumatic, no cyanosis or edema Pulses: 2+ and symmetric Skin: Skin color, texture, turgor normal. No rashes or lesions Lymph nodes: Cervical, supraclavicular, and axillary nodes normal. Neurologic: Alert and oriented X 3, normal strength and tone. Normal symmetric reflexes. Normal coordination and gait    Assessment:    Healthy female exam.      Plan:    ghm utd See After Visit Summary for Counseling Recommendations    1. Preventative health care See above   2. Hypothyroidism, unspecified type Check labs con't synthroid  - TSH  3. Hyperlipidemia,  unspecified hyperlipidemia type Tolerating statin, encouraged heart healthy diet, avoid trans fats, minimize simple carbs and saturated fats. Increase exercise as tolerated - Lipid panel - Comprehensive metabolic panel  4. Essential hypertension Poorly controlled will alter medications, encouraged DASH diet, minimize caffeine and obtain adequate sleep. Report concerning symptoms and follow up as directed and as needed - hydrochlorothiazide (HYDRODIURIL) 25 MG tablet; Take 1 tablet (25 mg total) by mouth daily.  Dispense: 30 tablet; Refill: 2

## 2019-06-08 NOTE — Assessment & Plan Note (Signed)
Tolerating statin, encouraged heart healthy diet, avoid trans fats, minimize simple carbs and saturated fats. Increase exercise as tolerated 

## 2019-06-08 NOTE — Assessment & Plan Note (Signed)
   Pt refuses bmd

## 2019-06-30 ENCOUNTER — Other Ambulatory Visit: Payer: Self-pay

## 2019-06-30 ENCOUNTER — Encounter: Payer: Self-pay | Admitting: Family Medicine

## 2019-06-30 ENCOUNTER — Ambulatory Visit (INDEPENDENT_AMBULATORY_CARE_PROVIDER_SITE_OTHER): Payer: Medicare Other | Admitting: Family Medicine

## 2019-06-30 VITALS — BP 160/70 | HR 91 | Temp 97.1°F | Resp 18 | Ht 64.5 in | Wt 208.0 lb

## 2019-06-30 DIAGNOSIS — I10 Essential (primary) hypertension: Secondary | ICD-10-CM

## 2019-06-30 DIAGNOSIS — D229 Melanocytic nevi, unspecified: Secondary | ICD-10-CM | POA: Diagnosis not present

## 2019-06-30 MED ORDER — LISINOPRIL-HYDROCHLOROTHIAZIDE 10-12.5 MG PO TABS: 1.0000 | ORAL_TABLET | Freq: Every day | ORAL | 2 refills | Status: DC

## 2019-06-30 NOTE — Progress Notes (Signed)
Patient ID: Carla Reilly, female    DOB: 02-May-1935  Age: 85 y.o. MRN: [id number]    Subjective:  Subjective  HPI [place] presents for f/u bp.  No complaints but her bp is still running high  Review of Systems  Constitutional: Negative for appetite change, diaphoresis, fatigue and unexpected weight change.  Eyes: Negative for pain, redness and visual disturbance.  Respiratory: Negative for cough, chest tightness, shortness of breath and wheezing.   Cardiovascular: Negative for chest pain, palpitations and leg swelling.  Endocrine: Negative for cold intolerance, heat intolerance, polydipsia, polyphagia and polyuria.  Genitourinary: Negative for difficulty urinating, dysuria and frequency.  Neurological: Negative for dizziness, light-headedness, numbness and headaches.    History Past Medical History:  Diagnosis Date  . Carpal tunnel syndrome   . Cataracts, bilateral   . Hyperlipidemia   . Thyroid disease    Hypothyroidism  . Wears dentures   . Wears glasses     She has a past surgical history that includes Tonsillectomy; Dilation and curettage of uterus (1980); Hysteroscopy with D & C (N/A, 03/16/2015); Total abdominal hysterectomy w/ bilateral salpingoophorectomy (Bilateral, 07/2015); and CATARACTS REMOVED OCT & NOV 2017.   Her family history includes Cancer in her father; Colon cancer (age of onset: 27) in her son; Dementia in her mother; Hypertension in her brother; Hypothyroidism in her mother; Prostate cancer in her father; Testicular cancer in her grandchild.She reports that she quit smoking about 47 years ago. She has never used smokeless tobacco. She reports that she does not drink alcohol or use drugs.  Current Outpatient Medications on File Prior to Visit  Medication Sig Dispense Refill  . Coenzyme Q10 (VITALINE COQ10 PO) Take 600 mg by mouth.    . EPINEPHrine 0.3 mg/0.3 mL IJ SOAJ injection Inject 0.3 mLs (0.3 mg total) into the muscle once. 3 Device 0  .  levothyroxine (SYNTHROID) 100 MCG tablet TAKE 1 TABLET BY MOUTH  DAILY 90 tablet 3  . Lutein 40 MG CAPS Take 1 capsule by mouth daily.    . simvastatin (ZOCOR) 40 MG tablet TAKE 1 TABLET BY MOUTH AT  BEDTIME 90 tablet 3  . Specialty Vitamins Products (RA EAR CARE) TABS Take by mouth as needed.      No current facility-administered medications on file prior to visit.     Objective:  Objective  Physical Exam Vitals and nursing note reviewed.  Constitutional:      Appearance: She is well-developed.  HENT:     Head: Normocephalic and atraumatic.  Eyes:     Conjunctiva/sclera: Conjunctivae normal.  Neck:     Thyroid: No thyromegaly.     Vascular: No carotid bruit or JVD.  Cardiovascular:     Rate and Rhythm: Normal rate and regular rhythm.     Heart sounds: Normal heart sounds. No murmur.  Pulmonary:     Effort: Pulmonary effort is normal. No respiratory distress.     Breath sounds: Normal breath sounds. No wheezing or rales.  Chest:     Chest wall: No tenderness.  Musculoskeletal:     Cervical back: Normal range of motion and neck supple.  Neurological:     Mental Status: She is alert and oriented to person, place, and time.    BP (!) 160/70 (BP Location: Right Arm, Patient Position: Sitting, Cuff Size: Large)   Pulse 91   Temp (!) 97.1 F (36.2 C) (Temporal)   Resp 18   Ht 5' 4.5" (1.638 m)   Wt 208 lb (  94.3 kg)   SpO2 97%   BMI 35.15 kg/m  Wt Readings from Last 3 Encounters:  06/30/19 208 lb (94.3 kg)  06/08/19 207 lb 3.2 oz (94 kg)  05/20/18 206 lb 6.4 oz (93.6 kg)     Lab Results  Component Value Date   WBC 5.9 05/20/2018   HGB 14.4 05/20/2018   HCT 42.6 05/20/2018   PLT 232.0 05/20/2018   GLUCOSE 104 (H) 06/08/2019   CHOL 162 06/08/2019   TRIG 134.0 06/08/2019   HDL 63.90 06/08/2019   LDLCALC 71 06/08/2019   ALT 19 06/08/2019   AST 17 06/08/2019   NA 142 06/08/2019   K 4.5 06/08/2019   CL 104 06/08/2019   CREATININE 0.88 06/08/2019   BUN 15  06/08/2019   CO2 28 06/08/2019   TSH 1.01 06/08/2019    MM 3D SCREEN BREAST BILATERAL  Result Date: 03/31/2019 CLINICAL DATA:  Screening. EXAM: DIGITAL SCREENING BILATERAL MAMMOGRAM WITH TOMO AND CAD COMPARISON:  Previous exam(s). ACR Breast Density Category b: There are scattered areas of fibroglandular density. FINDINGS: There are no findings suspicious for malignancy. Images were processed with CAD. IMPRESSION: No mammographic evidence of malignancy. A result letter of this screening mammogram will be mailed directly to the patient. RECOMMENDATION: Screening mammogram in one year. (Code:SM-B-01Y) BI-RADS CATEGORY  1: Negative. Electronically Signed   By: Lajean Manes M.D.   On: 03/31/2019 12:49   VAS [place] ABI WITH/WO TBI  Result Date: 03/31/2019 LOWER EXTREMITY DOPPLER STUDY Indications: Peripheral artery disease. High Risk Factors: Hyperlipidemia.  Comparison Study: None Available Performing Technologist: Darlina Sicilian RDCS  Examination Guidelines: A complete evaluation includes at minimum, Doppler waveform signals and systolic blood pressure reading at the level of bilateral brachial, anterior tibial, and posterior tibial arteries, when vessel segments are accessible. Bilateral testing is considered an integral part of a complete examination. Photoelectric Plethysmograph (PPG) waveforms and toe systolic pressure readings are included as required and additional duplex testing as needed. Limited examinations for reoccurring indications may be performed as noted.  ABI Findings: +--------+------------------+-----+---------+--------+ Right   Rt Pressure (mmHg)IndexWaveform Comment  +--------+------------------+-----+---------+--------+ [id number]                    triphasic         +--------+------------------+-----+---------+--------+ ATA     189               1.06 triphasic         +--------+------------------+-----+---------+--------+ PTA     192               1.08 triphasic          +--------+------------------+-----+---------+--------+ +--------+------------------+-----+---------+-------+ Left    Lt Pressure (mmHg)IndexWaveform Comment +--------+------------------+-----+---------+-------+ [id number]                    triphasic        +--------+------------------+-----+---------+-------+ ATA     188               1.06 triphasic        +--------+------------------+-----+---------+-------+ PTA     187               1.05 triphasic        +--------+------------------+-----+---------+-------+ +-------+-----------+-----------+------------+------------+ ABI/TBIToday's ABIToday's TBIPrevious ABIPrevious TBI +-------+-----------+-----------+------------+------------+ Right  1.08                                           +-------+-----------+-----------+------------+------------+  Left   1.06                                           +-------+-----------+-----------+------------+------------+  Summary: Right: Resting right ankle-brachial index is within normal range. No evidence of significant right lower extremity arterial disease. Left: Resting left ankle-brachial index is within normal range. No evidence of significant left lower extremity arterial disease.  *See table(s) above for measurements and observations.  Electronically signed by Shirlee More MD on 03/31/2019 at 12:33:22 PM.   Final      Assessment & Plan:  Plan  I have discontinued Carla Reilly's hydrochlorothiazide. I am also having her start on lisinopril-hydrochlorothiazide. Additionally, I am having her maintain her Coenzyme Q10 (VITALINE COQ10 PO), Lutein, RA Ear Care, EPINEPHrine, simvastatin, and levothyroxine.  Meds ordered this encounter  Medications  . lisinopril-hydrochlorothiazide (ZESTORETIC) 10-12.5 MG tablet    Sig: Take 1 tablet by mouth daily.    Dispense:  30 tablet    Refill:  2    Problem List Items Addressed This Visit      Unprioritized   Essential  hypertension - Primary    Poorly controlled will alter medications, encouraged DASH diet, minimize caffeine and obtain adequate sleep. Report concerning symptoms and follow up as directed and as needed      Relevant Medications   lisinopril-hydrochlorothiazide (ZESTORETIC) 10-12.5 MG tablet   Numerous skin moles   Relevant Orders   Ambulatory referral to Dermatology     pt requesting referral to derm -- because her derm retired   Follow-up: Return in about 3 months (around 09/28/2019), or if symptoms worsen or fail to improve, for hypertension.  Ann Held, DO

## 2019-06-30 NOTE — Patient Instructions (Signed)
COVID-19 Vaccine Information can be found at: [email or web address].uk For questions related to vaccine distribution or appointments, please email [email or web address] or call [phone].    DASH Eating Plan DASH stands for "Dietary Approaches to Stop Hypertension." The DASH eating plan is a healthy eating plan that has been shown to reduce high blood pressure (hypertension). It may also reduce your risk for type 2 diabetes, heart disease, and stroke. The DASH eating plan may also help with weight loss. What are tips for following this plan?  General guidelines  Avoid eating more than 2,300 mg (milligrams) of salt (sodium) a day. If you have hypertension, you may need to reduce your sodium intake to 1,500 mg a day.  Limit alcohol intake to no more than 1 drink a day for nonpregnant women and 2 drinks a day for men. One drink equals 12 oz of beer, 5 oz of wine, or 1 oz of hard liquor.  Work with your health care provider to maintain a healthy body weight or to lose weight. Ask what an ideal weight is for you.  Get at least 30 minutes of exercise that causes your heart to beat faster (aerobic exercise) most days of the week. Activities may include walking, swimming, or biking.  Work with your health care provider or diet and nutrition specialist (dietitian) to adjust your eating plan to your individual calorie needs. Reading food labels   Check food labels for the amount of sodium per serving. Choose foods with less than 5 percent of the Daily Value of sodium. Generally, foods with less than 300 mg of sodium per serving fit into this eating plan.  To find whole grains, look for the word "whole" as the first word in the ingredient list. Shopping  Buy products labeled as "low-sodium" or "no salt added."  Buy fresh foods. Avoid canned foods and premade or frozen meals. Cooking  Avoid adding salt when cooking. Use salt-free seasonings  or herbs instead of table salt or sea salt. Check with your health care provider or pharmacist before using salt substitutes.  Do not fry foods. Cook foods using healthy methods such as baking, boiling, grilling, and broiling instead.  Cook with heart-healthy oils, such as olive, canola, soybean, or sunflower oil. Meal planning  Eat a balanced diet that includes: ? 5 or more servings of fruits and vegetables each day. At each meal, try to fill half of your plate with fruits and vegetables. ? Up to 6-8 servings of whole grains each day. ? Less than 6 oz of lean meat, poultry, or fish each day. A 3-oz serving of meat is about the same size as a deck of cards. One egg equals 1 oz. ? 2 servings of low-fat dairy each day. ? A serving of nuts, seeds, or beans 5 times each week. ? Heart-healthy fats. Healthy fats called Omega-3 fatty acids are found in foods such as flaxseeds and coldwater fish, like sardines, salmon, and mackerel.  Limit how much you eat of the following: ? Canned or prepackaged foods. ? Food that is high in trans fat, such as fried foods. ? Food that is high in saturated fat, such as fatty meat. ? Sweets, desserts, sugary drinks, and other foods with added sugar. ? Full-fat dairy products.  Do not salt foods before eating.  Try to eat at least 2 vegetarian meals each week.  Eat more home-cooked food and less restaurant, buffet, and fast food.  When eating at a restaurant, ask that your food be prepared with  less salt or no salt, if possible. What foods are recommended? The items listed may not be a complete list. Talk with your dietitian about what dietary choices are best for you. Grains Whole-grain or whole-wheat bread. Whole-grain or whole-wheat pasta. Brown rice. [place]. Bulgur. Whole-grain and low-sodium cereals. Pita bread. Low-fat, low-sodium crackers. Whole-wheat flour tortillas. Vegetables Fresh or frozen vegetables (raw, steamed, roasted, or grilled).  Low-sodium or reduced-sodium tomato and vegetable juice. Low-sodium or reduced-sodium tomato sauce and tomato paste. Low-sodium or reduced-sodium canned vegetables. Fruits All fresh, dried, or frozen fruit. Canned fruit in natural juice (without added sugar). Meat and other protein foods Skinless chicken or [place]. Ground chicken or [place]. Pork with fat trimmed off. Fish and seafood. Egg whites. Dried beans, peas, or lentils. Unsalted nuts, nut butters, and seeds. Unsalted canned beans. Lean cuts of beef with fat trimmed off. Low-sodium, lean deli meat. Dairy Low-fat (1%) or fat-free (skim) milk. Fat-free, low-fat, or reduced-fat cheeses. Nonfat, low-sodium ricotta or cottage cheese. Low-fat or nonfat yogurt. Low-fat, low-sodium cheese. Fats and oils Soft margarine without trans fats. Vegetable oil. Low-fat, reduced-fat, or light mayonnaise and salad dressings (reduced-sodium). Canola, safflower, olive, soybean, and sunflower oils. Avocado. Seasoning and other foods Herbs. Spices. Seasoning mixes without salt. Unsalted popcorn and pretzels. Fat-free sweets. What foods are not recommended? The items listed may not be a complete list. Talk with your dietitian about what dietary choices are best for you. Grains Baked goods made with fat, such as croissants, muffins, or some breads. Dry pasta or rice meal packs. Vegetables Creamed or fried vegetables. Vegetables in a cheese sauce. Regular canned vegetables (not low-sodium or reduced-sodium). Regular canned tomato sauce and paste (not low-sodium or reduced-sodium). Regular tomato and vegetable juice (not low-sodium or reduced-sodium). Angie Fava. Olives. Fruits Canned fruit in a light or heavy syrup. Fried fruit. Fruit in cream or butter sauce. Meat and other protein foods Fatty cuts of meat. Ribs. Fried meat. Berniece Salines. Sausage. Bologna and other processed lunch meats. Salami. Fatback. Hotdogs. Bratwurst. Salted nuts and seeds. Canned beans with added  salt. Canned or smoked fish. Whole eggs or egg yolks. Chicken or [place] with skin. Dairy Whole or 2% milk, cream, and half-and-half. Whole or full-fat cream cheese. Whole-fat or sweetened yogurt. Full-fat cheese. Nondairy creamers. Whipped toppings. Processed cheese and cheese spreads. Fats and oils Butter. Stick margarine. Lard. Shortening. Ghee. Bacon fat. Tropical oils, such as coconut, palm kernel, or palm oil. Seasoning and other foods Salted popcorn and pretzels. Onion salt, garlic salt, seasoned salt, table salt, and sea salt. Worcestershire sauce. Tartar sauce. Barbecue sauce. Teriyaki sauce. Soy sauce, including reduced-sodium. Steak sauce. Canned and packaged gravies. Fish sauce. Oyster sauce. Cocktail sauce. Horseradish that you find on the shelf. Ketchup. Mustard. Meat flavorings and tenderizers. Bouillon cubes. Hot sauce and Tabasco sauce. Premade or packaged marinades. Premade or packaged taco seasonings. Relishes. Regular salad dressings. Where to find more information:  National Heart, Lung, and [place]: https://wilson-eaton.com/  American Heart Association: www.heart.org Summary  The DASH eating plan is a healthy eating plan that has been shown to reduce high blood pressure (hypertension). It may also reduce your risk for type 2 diabetes, heart disease, and stroke.  With the DASH eating plan, you should limit salt (sodium) intake to 2,300 mg a day. If you have hypertension, you may need to reduce your sodium intake to 1,500 mg a day.  When on the DASH eating plan, aim to eat more fresh fruits and vegetables, whole grains, lean proteins,  low-fat dairy, and heart-healthy fats.  Work with your health care provider or diet and nutrition specialist (dietitian) to adjust your eating plan to your individual calorie needs. This information is not intended to replace advice given to you by your health care provider. Make sure you discuss any questions you have with your health care  provider. Document Revised: 05/03/2017 Document Reviewed: 05/14/2016 Elsevier Patient Education  2020 [employer].

## 2019-07-01 DIAGNOSIS — D229 Melanocytic nevi, unspecified: Secondary | ICD-10-CM | POA: Insufficient documentation

## 2019-07-01 NOTE — Assessment & Plan Note (Signed)
Poorly controlled will alter medications, encouraged DASH diet, minimize caffeine and obtain adequate sleep. Report concerning symptoms and follow up as directed and as needed 

## 2019-07-11 ENCOUNTER — Ambulatory Visit: Payer: Medicare Other | Attending: Internal Medicine

## 2019-07-11 DIAGNOSIS — Z23 Encounter for immunization: Secondary | ICD-10-CM | POA: Insufficient documentation

## 2019-07-11 NOTE — Progress Notes (Signed)
   Covid-19 Vaccination Clinic  Name:  Carla Reilly    MRN: [id number] DOB: [birth year]  07/11/2019  [name] was observed post Covid-19 immunization for 15 minutes without incidence. She was provided with Vaccine Information Sheet and instruction to access the V-Safe system.   [name] was instructed to call 911 with any severe reactions post vaccine: Marland Kitchen Difficulty breathing  . Swelling of your face and throat  . A fast heartbeat  . A bad rash all over your body  . Dizziness and weakness    Immunizations Administered    Name Date Dose VIS Date Route   Pfizer COVID-19 Vaccine 07/11/2019  8:28 AM 0.3 mL 05/15/2019 Intramuscular   Manufacturer: [place]   Lot: [id number]   [place]: [id number]

## 2019-07-22 DIAGNOSIS — L821 Other seborrheic keratosis: Secondary | ICD-10-CM | POA: Diagnosis not present

## 2019-07-22 DIAGNOSIS — L570 Actinic keratosis: Secondary | ICD-10-CM | POA: Diagnosis not present

## 2019-07-30 ENCOUNTER — Ambulatory Visit: Payer: Medicare Other

## 2019-08-04 ENCOUNTER — Ambulatory Visit: Payer: Medicare Other

## 2019-08-04 ENCOUNTER — Ambulatory Visit: Payer: Medicare Other | Attending: Internal Medicine

## 2019-08-04 DIAGNOSIS — Z23 Encounter for immunization: Secondary | ICD-10-CM | POA: Insufficient documentation

## 2019-08-04 NOTE — Progress Notes (Signed)
   Covid-19 Vaccination Clinic  Name:  Carla Reilly    MRN: [id number] DOB: 01-22-1935  08/04/2019  Ms. Mastrogiovanni was observed post Covid-19 immunization for 15 minutes without incident. She was provided with Vaccine Information Sheet and instruction to access the V-Safe system.   Ms. Eimers was instructed to call 911 with any severe reactions post vaccine: Marland Kitchen Difficulty breathing  . Swelling of face and throat  . A fast heartbeat  . A bad rash all over body  . Dizziness and weakness   Immunizations Administered    Name Date Dose VIS Date Route   Pfizer COVID-19 Vaccine 08/04/2019  2:15 PM 0.3 mL 05/15/2019 Intramuscular   Manufacturer: [place]   Lot: [id number]   [place]: [id number]

## 2019-08-06 ENCOUNTER — Telehealth: Payer: Self-pay | Admitting: Family Medicine

## 2019-08-06 ENCOUNTER — Telehealth: Payer: Self-pay

## 2019-08-06 MED ORDER — LOSARTAN POTASSIUM-HCTZ 50-12.5 MG PO TABS: 1.0000 | ORAL_TABLET | Freq: Every day | ORAL | 3 refills | Status: DC

## 2019-08-06 NOTE — Telephone Encounter (Signed)
Patient called in concern about her prescription  That was sent in today for losartan-hydrochlorothiazide (HYZAAR) 50-12.5 MG tablet [id number]  For 50 mg  Per the patient she states that she normally take 10 MG of this medication please call the patient back as soon as possible to advise her on what to do because she is scared to take the medication at 50 MG.  Please call the patient at [phone]   Thanks,

## 2019-08-06 NOTE — Telephone Encounter (Signed)
Change the lisinopril hct to losartan 50/12.5    f/u in 2-3 weeks as long as cough resolves

## 2019-08-06 NOTE — Telephone Encounter (Signed)
Please advise 

## 2019-08-06 NOTE — Telephone Encounter (Signed)
Pt states she is having daily coughing spells off and on.  She thinks her cough coming alll from the throat not the lungs. She thinks its a reaction from lisinopril-hydrochlorothiazide (ZESTORETIC) 10-12.5....   She mentioned that the cough had her up late last night.. please advise

## 2019-08-06 NOTE — Telephone Encounter (Signed)
Spoke with patient and let her know we have d/c the other medication and sent in a new rx for her she voiced her understanding

## 2019-08-10 NOTE — Telephone Encounter (Signed)
Cant really compare doses------- she was on lisinopril 10/ 12.5 --- lowest dose and losartan 50 12.5 is the lowest dose  Does she feel ok ?

## 2019-08-10 NOTE — Telephone Encounter (Signed)
Pt concerned. She was taking lisinopril- hctz at 12.5 MG and the new med losartan-hctz is at 50-12.6 MG. Pt concerned that is too strong. Please advise

## 2019-08-10 NOTE — Telephone Encounter (Signed)
Attempted to call patient. Phone kept ringing. No VM 

## 2019-10-13 ENCOUNTER — Other Ambulatory Visit: Payer: Self-pay

## 2019-10-13 ENCOUNTER — Ambulatory Visit (INDEPENDENT_AMBULATORY_CARE_PROVIDER_SITE_OTHER): Payer: Medicare Other | Admitting: Family Medicine

## 2019-10-13 ENCOUNTER — Encounter: Payer: Self-pay | Admitting: Family Medicine

## 2019-10-13 VITALS — BP 120/60 | HR 68 | Temp 97.5°F | Resp 18 | Ht 64.5 in | Wt 200.0 lb

## 2019-10-13 DIAGNOSIS — I10 Essential (primary) hypertension: Secondary | ICD-10-CM | POA: Diagnosis not present

## 2019-10-13 DIAGNOSIS — E785 Hyperlipidemia, unspecified: Secondary | ICD-10-CM

## 2019-10-13 DIAGNOSIS — E039 Hypothyroidism, unspecified: Secondary | ICD-10-CM | POA: Diagnosis not present

## 2019-10-13 MED ORDER — LOSARTAN POTASSIUM-HCTZ 50-12.5 MG PO TABS: 1.0000 | ORAL_TABLET | Freq: Every day | ORAL | 3 refills | Status: DC

## 2019-10-13 MED ORDER — LEVOTHYROXINE SODIUM 100 MCG PO TABS: 100.0000 ug | ORAL_TABLET | Freq: Every day | ORAL | 3 refills | Status: DC

## 2019-10-13 MED ORDER — SIMVASTATIN 40 MG PO TABS: 40.0000 mg | ORAL_TABLET | Freq: Every day | ORAL | 3 refills | Status: DC

## 2019-10-13 NOTE — Progress Notes (Signed)
Patient ID: Carla Reilly, female    DOB: 01-01-1935  Age: 85 y.o. MRN: [id number]    Subjective:  Subjective  HPI [place] presents for f/u bp, chol and thyroid.   No complaints   Review of Systems  Constitutional: Negative for appetite change, diaphoresis, fatigue and unexpected weight change.  Eyes: Negative for pain, redness and visual disturbance.  Respiratory: Negative for cough, chest tightness, shortness of breath and wheezing.   Cardiovascular: Negative for chest pain, palpitations and leg swelling.  Endocrine: Negative for cold intolerance, heat intolerance, polydipsia, polyphagia and polyuria.  Genitourinary: Negative for difficulty urinating, dysuria and frequency.  Neurological: Negative for dizziness, light-headedness, numbness and headaches.    History Past Medical History:  Diagnosis Date  . Carpal tunnel syndrome   . Cataracts, bilateral   . Hyperlipidemia   . Thyroid disease    Hypothyroidism  . Wears dentures   . Wears glasses     She has a past surgical history that includes Tonsillectomy; Dilation and curettage of uterus (1980); Hysteroscopy with D & C (N/A, 03/16/2015); Total abdominal hysterectomy w/ bilateral salpingoophorectomy (Bilateral, 07/2015); and CATARACTS REMOVED OCT & NOV 2017.   Her family history includes Cancer in her father; Colon cancer (age of onset: 2) in her son; Dementia in her mother; Hypertension in her brother; Hypothyroidism in her mother; Prostate cancer in her father; Testicular cancer in her grandchild.She reports that she quit smoking about 47 years ago. She has never used smokeless tobacco. She reports that she does not drink alcohol or use drugs.  Current Outpatient Medications on File Prior to Visit  Medication Sig Dispense Refill  . Coenzyme Q10 (VITALINE COQ10 PO) Take 600 mg by mouth.    . EPINEPHrine 0.3 mg/0.3 mL IJ SOAJ injection Inject 0.3 mLs (0.3 mg total) into the muscle once. 3 Device 0  . Lutein 40 MG CAPS  Take 1 capsule by mouth daily.    Marland Kitchen Specialty Vitamins Products (RA EAR CARE) TABS Take by mouth as needed.      No current facility-administered medications on file prior to visit.     Objective:  Objective  Physical Exam Vitals and nursing note reviewed.  Constitutional:      Appearance: She is well-developed.  HENT:     Head: Normocephalic and atraumatic.  Eyes:     Conjunctiva/sclera: Conjunctivae normal.  Neck:     Thyroid: No thyromegaly.     Vascular: No carotid bruit or JVD.  Cardiovascular:     Rate and Rhythm: Normal rate and regular rhythm.     Heart sounds: Normal heart sounds. No murmur.  Pulmonary:     Effort: Pulmonary effort is normal. No respiratory distress.     Breath sounds: Normal breath sounds. No wheezing or rales.  Chest:     Chest wall: No tenderness.  Musculoskeletal:     Cervical back: Normal range of motion and neck supple.  Neurological:     Mental Status: She is alert and oriented to person, place, and time.    BP 120/60 (BP Location: Left Arm, Patient Position: Sitting, Cuff Size: Large)   Pulse 68   Temp (!) 97.5 F (36.4 C) (Temporal)   Resp 18   Ht 5' 4.5" (1.638 m)   Wt 200 lb (90.7 kg)   SpO2 94%   BMI 33.80 kg/m  Wt Readings from Last 3 Encounters:  10/13/19 200 lb (90.7 kg)  06/30/19 208 lb (94.3 kg)  06/08/19 207 lb 3.2 oz (94 kg)  Lab Results  Component Value Date   WBC 5.9 05/20/2018   HGB 14.4 05/20/2018   HCT 42.6 05/20/2018   PLT 232.0 05/20/2018   GLUCOSE 104 (H) 06/08/2019   CHOL 162 06/08/2019   TRIG 134.0 06/08/2019   HDL 63.90 06/08/2019   LDLCALC 71 06/08/2019   ALT 19 06/08/2019   AST 17 06/08/2019   NA 142 06/08/2019   K 4.5 06/08/2019   CL 104 06/08/2019   CREATININE 0.88 06/08/2019   BUN 15 06/08/2019   CO2 28 06/08/2019   TSH 1.01 06/08/2019    MM 3D SCREEN BREAST BILATERAL  Result Date: 03/31/2019 CLINICAL DATA:  Screening. EXAM: DIGITAL SCREENING BILATERAL MAMMOGRAM WITH TOMO AND CAD  COMPARISON:  Previous exam(s). ACR Breast Density Category b: There are scattered areas of fibroglandular density. FINDINGS: There are no findings suspicious for malignancy. Images were processed with CAD. IMPRESSION: No mammographic evidence of malignancy. A result letter of this screening mammogram will be mailed directly to the patient. RECOMMENDATION: Screening mammogram in one year. (Code:SM-B-01Y) BI-RADS CATEGORY  1: Negative. Electronically Signed   By: Lajean Manes M.D.   On: 03/31/2019 12:49   VAS [place] ABI WITH/WO TBI  Result Date: 03/31/2019 LOWER EXTREMITY DOPPLER STUDY Indications: Peripheral artery disease. High Risk Factors: Hyperlipidemia.  Comparison Study: None Available Performing Technologist: Darlina Sicilian RDCS  Examination Guidelines: A complete evaluation includes at minimum, Doppler waveform signals and systolic blood pressure reading at the level of bilateral brachial, anterior tibial, and posterior tibial arteries, when vessel segments are accessible. Bilateral testing is considered an integral part of a complete examination. Photoelectric Plethysmograph (PPG) waveforms and toe systolic pressure readings are included as required and additional duplex testing as needed. Limited examinations for reoccurring indications may be performed as noted.  ABI Findings: +--------+------------------+-----+---------+--------+ Right   Rt Pressure (mmHg)IndexWaveform Comment  +--------+------------------+-----+---------+--------+ [id number]                    triphasic         +--------+------------------+-----+---------+--------+ ATA     189               1.06 triphasic         +--------+------------------+-----+---------+--------+ PTA     192               1.08 triphasic         +--------+------------------+-----+---------+--------+ +--------+------------------+-----+---------+-------+ Left    Lt Pressure (mmHg)IndexWaveform Comment  +--------+------------------+-----+---------+-------+ [id number]                    triphasic        +--------+------------------+-----+---------+-------+ ATA     188               1.06 triphasic        +--------+------------------+-----+---------+-------+ PTA     187               1.05 triphasic        +--------+------------------+-----+---------+-------+ +-------+-----------+-----------+------------+------------+ ABI/TBIToday's ABIToday's TBIPrevious ABIPrevious TBI +-------+-----------+-----------+------------+------------+ Right  1.08                                           +-------+-----------+-----------+------------+------------+ Left   1.06                                           +-------+-----------+-----------+------------+------------+  Summary: Right: Resting right ankle-brachial index is within normal range. No evidence of significant right lower extremity arterial disease. Left: Resting left ankle-brachial index is within normal range. No evidence of significant left lower extremity arterial disease.  *See table(s) above for measurements and observations.  Electronically signed by Shirlee More MD on 03/31/2019 at 12:33:22 PM.   Final      Assessment & Plan:  Plan  I have changed Carla Reilly's simvastatin and levothyroxine. I am also having her maintain her Coenzyme Q10 (VITALINE COQ10 PO), Lutein, RA Ear Care, EPINEPHrine, and losartan-hydrochlorothiazide.  Meds ordered this encounter  Medications  . simvastatin (ZOCOR) 40 MG tablet    Sig: Take 1 tablet (40 mg total) by mouth at bedtime.    Dispense:  90 tablet    Refill:  3    Requesting 1 year supply  . levothyroxine (SYNTHROID) 100 MCG tablet    Sig: Take 1 tablet (100 mcg total) by mouth daily.    Dispense:  90 tablet    Refill:  3    Requesting 1 year supply  . losartan-hydrochlorothiazide (HYZAAR) 50-12.5 MG tablet    Sig: Take 1 tablet by mouth daily.    Dispense:  90 tablet     Refill:  3    Problem List Items Addressed This Visit      Unprioritized   Essential hypertension - Primary    Well controlled, no changes to meds. Encouraged heart healthy diet such as the DASH diet and exercise as tolerated.       Relevant Medications   simvastatin (ZOCOR) 40 MG tablet   losartan-hydrochlorothiazide (HYZAAR) 50-12.5 MG tablet   Other Relevant Orders   Comprehensive metabolic panel   Hyperlipidemia    Tolerating statin, encouraged heart healthy diet, avoid trans fats, minimize simple carbs and saturated fats. Increase exercise as tolerated      Relevant Medications   simvastatin (ZOCOR) 40 MG tablet   losartan-hydrochlorothiazide (HYZAAR) 50-12.5 MG tablet   Other Relevant Orders   Lipid panel   Comprehensive metabolic panel   Hypothyroidism    Check labs  con't meds       Relevant Medications   levothyroxine (SYNTHROID) 100 MCG tablet   Other Relevant Orders   TSH      Follow-up: Return in about 6 months (around 04/14/2020), or if symptoms worsen or fail to improve, for hypertension, hyperlipidemia--- labs in July .  Ann Held, DO

## 2019-10-13 NOTE — Patient Instructions (Signed)
DASH Eating Plan DASH stands for "Dietary Approaches to Stop Hypertension." The DASH eating plan is a healthy eating plan that has been shown to reduce high blood pressure (hypertension). It may also reduce your risk for type 2 diabetes, heart disease, and stroke. The DASH eating plan may also help with weight loss. What are tips for following this plan?  General guidelines  Avoid eating more than 2,300 mg (milligrams) of salt (sodium) a day. If you have hypertension, you may need to reduce your sodium intake to 1,500 mg a day.  Limit alcohol intake to no more than 1 drink a day for nonpregnant women and 2 drinks a day for men. One drink equals 12 oz of beer, 5 oz of wine, or 1 oz of hard liquor.  Work with your health care provider to maintain a healthy body weight or to lose weight. Ask what an ideal weight is for you.  Get at least 30 minutes of exercise that causes your heart to beat faster (aerobic exercise) most days of the week. Activities may include walking, swimming, or biking.  Work with your health care provider or diet and nutrition specialist (dietitian) to adjust your eating plan to your individual calorie needs. Reading food labels   Check food labels for the amount of sodium per serving. Choose foods with less than 5 percent of the Daily Value of sodium. Generally, foods with less than 300 mg of sodium per serving fit into this eating plan.  To find whole grains, look for the word "whole" as the first word in the ingredient list. Shopping  Buy products labeled as "low-sodium" or "no salt added."  Buy fresh foods. Avoid canned foods and premade or frozen meals. Cooking  Avoid adding salt when cooking. Use salt-free seasonings or herbs instead of table salt or sea salt. Check with your health care provider or pharmacist before using salt substitutes.  Do not fry foods. Cook foods using healthy methods such as baking, boiling, grilling, and broiling instead.  Cook with  heart-healthy oils, such as olive, canola, soybean, or sunflower oil. Meal planning  Eat a balanced diet that includes: ? 5 or more servings of fruits and vegetables each day. At each meal, try to fill half of your plate with fruits and vegetables. ? Up to 6-8 servings of whole grains each day. ? Less than 6 oz of lean meat, poultry, or fish each day. A 3-oz serving of meat is about the same size as a deck of cards. One egg equals 1 oz. ? 2 servings of low-fat dairy each day. ? A serving of nuts, seeds, or beans 5 times each week. ? Heart-healthy fats. Healthy fats called Omega-3 fatty acids are found in foods such as flaxseeds and coldwater fish, like sardines, salmon, and mackerel.  Limit how much you eat of the following: ? Canned or prepackaged foods. ? Food that is high in trans fat, such as fried foods. ? Food that is high in saturated fat, such as fatty meat. ? Sweets, desserts, sugary drinks, and other foods with added sugar. ? Full-fat dairy products.  Do not salt foods before eating.  Try to eat at least 2 vegetarian meals each week.  Eat more home-cooked food and less restaurant, buffet, and fast food.  When eating at a restaurant, ask that your food be prepared with less salt or no salt, if possible. What foods are recommended? The items listed may not be a complete list. Talk with your dietitian about   what dietary choices are best for you. Grains Whole-grain or whole-wheat bread. Whole-grain or whole-wheat pasta. Brown rice. Oatmeal. Quinoa. Bulgur. Whole-grain and low-sodium cereals. Pita bread. Low-fat, low-sodium crackers. Whole-wheat flour tortillas. Vegetables Fresh or frozen vegetables (raw, steamed, roasted, or grilled). Low-sodium or reduced-sodium tomato and vegetable juice. Low-sodium or reduced-sodium tomato sauce and tomato paste. Low-sodium or reduced-sodium canned vegetables. Fruits All fresh, dried, or frozen fruit. Canned fruit in natural juice (without  added sugar). Meat and other protein foods Skinless chicken or turkey. Ground chicken or turkey. Pork with fat trimmed off. Fish and seafood. Egg whites. Dried beans, peas, or lentils. Unsalted nuts, nut butters, and seeds. Unsalted canned beans. Lean cuts of beef with fat trimmed off. Low-sodium, lean deli meat. Dairy Low-fat (1%) or fat-free (skim) milk. Fat-free, low-fat, or reduced-fat cheeses. Nonfat, low-sodium ricotta or cottage cheese. Low-fat or nonfat yogurt. Low-fat, low-sodium cheese. Fats and oils Soft margarine without trans fats. Vegetable oil. Low-fat, reduced-fat, or light mayonnaise and salad dressings (reduced-sodium). Canola, safflower, olive, soybean, and sunflower oils. Avocado. Seasoning and other foods Herbs. Spices. Seasoning mixes without salt. Unsalted popcorn and pretzels. Fat-free sweets. What foods are not recommended? The items listed may not be a complete list. Talk with your dietitian about what dietary choices are best for you. Grains Baked goods made with fat, such as croissants, muffins, or some breads. Dry pasta or rice meal packs. Vegetables Creamed or fried vegetables. Vegetables in a cheese sauce. Regular canned vegetables (not low-sodium or reduced-sodium). Regular canned tomato sauce and paste (not low-sodium or reduced-sodium). Regular tomato and vegetable juice (not low-sodium or reduced-sodium). Pickles. Olives. Fruits Canned fruit in a light or heavy syrup. Fried fruit. Fruit in cream or butter sauce. Meat and other protein foods Fatty cuts of meat. Ribs. Fried meat. Bacon. Sausage. Bologna and other processed lunch meats. Salami. Fatback. Hotdogs. Bratwurst. Salted nuts and seeds. Canned beans with added salt. Canned or smoked fish. Whole eggs or egg yolks. Chicken or turkey with skin. Dairy Whole or 2% milk, cream, and half-and-half. Whole or full-fat cream cheese. Whole-fat or sweetened yogurt. Full-fat cheese. Nondairy creamers. Whipped toppings.  Processed cheese and cheese spreads. Fats and oils Butter. Stick margarine. Lard. Shortening. Ghee. Bacon fat. Tropical oils, such as coconut, palm kernel, or palm oil. Seasoning and other foods Salted popcorn and pretzels. Onion salt, garlic salt, seasoned salt, table salt, and sea salt. Worcestershire sauce. Tartar sauce. Barbecue sauce. Teriyaki sauce. Soy sauce, including reduced-sodium. Steak sauce. Canned and packaged gravies. Fish sauce. Oyster sauce. Cocktail sauce. Horseradish that you find on the shelf. Ketchup. Mustard. Meat flavorings and tenderizers. Bouillon cubes. Hot sauce and Tabasco sauce. Premade or packaged marinades. Premade or packaged taco seasonings. Relishes. Regular salad dressings. Where to find more information:  National Heart, Lung, and Blood Institute: www.nhlbi.nih.gov  American Heart Association: www.heart.org Summary  The DASH eating plan is a healthy eating plan that has been shown to reduce high blood pressure (hypertension). It may also reduce your risk for type 2 diabetes, heart disease, and stroke.  With the DASH eating plan, you should limit salt (sodium) intake to 2,300 mg a day. If you have hypertension, you may need to reduce your sodium intake to 1,500 mg a day.  When on the DASH eating plan, aim to eat more fresh fruits and vegetables, whole grains, lean proteins, low-fat dairy, and heart-healthy fats.  Work with your health care provider or diet and nutrition specialist (dietitian) to adjust your eating plan to your   individual calorie needs. This information is not intended to replace advice given to you by your health care provider. Make sure you discuss any questions you have with your health care provider. Document Revised: 05/03/2017 Document Reviewed: 05/14/2016 Elsevier Patient Education  2020 Elsevier Inc.  

## 2019-10-14 NOTE — Assessment & Plan Note (Signed)
Tolerating statin, encouraged heart healthy diet, avoid trans fats, minimize simple carbs and saturated fats. Increase exercise as tolerated 

## 2019-10-14 NOTE — Assessment & Plan Note (Signed)
Well controlled, no changes to meds. Encouraged heart healthy diet such as the DASH diet and exercise as tolerated.  °

## 2019-10-14 NOTE — Assessment & Plan Note (Signed)
Stable Check labs  con't meds

## 2019-10-14 NOTE — Assessment & Plan Note (Signed)
Check labs con't meds 

## 2019-12-08 ENCOUNTER — Other Ambulatory Visit: Payer: Self-pay

## 2019-12-08 ENCOUNTER — Other Ambulatory Visit (INDEPENDENT_AMBULATORY_CARE_PROVIDER_SITE_OTHER): Payer: Medicare Other

## 2019-12-08 DIAGNOSIS — E039 Hypothyroidism, unspecified: Secondary | ICD-10-CM | POA: Diagnosis not present

## 2019-12-08 DIAGNOSIS — E785 Hyperlipidemia, unspecified: Secondary | ICD-10-CM | POA: Diagnosis not present

## 2019-12-08 DIAGNOSIS — I10 Essential (primary) hypertension: Secondary | ICD-10-CM

## 2019-12-08 LAB — LIPID PANEL
Cholesterol: 132 mg/dL (ref 0–200)
HDL: 50.4 mg/dL (ref >39.00)
LDL Cholesterol: 54 mg/dL (ref 0–99)
NonHDL: 81.76
Total CHOL/HDL Ratio: 3
Triglycerides: 137 mg/dL (ref 0.0–149.0)
VLDL: 27.4 mg/dL (ref 0.0–40.0)

## 2019-12-08 LAB — COMPREHENSIVE METABOLIC PANEL
ALT: 25 U/L (ref 0–35)
AST: 21 U/L (ref 0–37)
Albumin: 4.3 g/dL (ref 3.5–5.2)
Alkaline Phosphatase: 97 U/L (ref 39–117)
BUN: 16 mg/dL (ref 6–23)
CO2: 27 mEq/L (ref 19–32)
Calcium: 9.8 mg/dL (ref 8.4–10.5)
Chloride: 103 mEq/L (ref 96–112)
Creatinine, Ser: 0.92 mg/dL (ref 0.40–1.20)
GFR: 58.34 mL/min — ABNORMAL LOW (ref >60.00)
Glucose, Bld: 104 mg/dL — ABNORMAL HIGH (ref 70–99)
Potassium: 3.9 mEq/L (ref 3.5–5.1)
Sodium: 141 mEq/L (ref 135–145)
Total Bilirubin: 0.6 mg/dL (ref 0.2–1.2)
Total Protein: 6.6 g/dL (ref 6.0–8.3)

## 2019-12-08 LAB — TSH: TSH: 0.29 u[IU]/mL — ABNORMAL LOW (ref 0.35–4.50)

## 2019-12-09 ENCOUNTER — Other Ambulatory Visit: Payer: Self-pay

## 2019-12-09 DIAGNOSIS — E039 Hypothyroidism, unspecified: Secondary | ICD-10-CM

## 2019-12-09 MED ORDER — LEVOTHYROXINE SODIUM 88 MCG PO TABS: 88.0000 ug | ORAL_TABLET | Freq: Every day | ORAL | 2 refills | Status: DC

## 2020-02-24 ENCOUNTER — Other Ambulatory Visit: Payer: Self-pay | Admitting: Family Medicine

## 2020-02-24 DIAGNOSIS — L820 Inflamed seborrheic keratosis: Secondary | ICD-10-CM | POA: Diagnosis not present

## 2020-02-24 DIAGNOSIS — L570 Actinic keratosis: Secondary | ICD-10-CM | POA: Diagnosis not present

## 2020-02-26 ENCOUNTER — Other Ambulatory Visit: Payer: Self-pay | Admitting: Family Medicine

## 2020-02-26 DIAGNOSIS — E785 Hyperlipidemia, unspecified: Secondary | ICD-10-CM

## 2020-03-08 NOTE — Addendum Note (Signed)
Addended by: Kelle Darting A on: 03/08/2020 11:14 AM   Modules accepted: Orders

## 2020-03-09 ENCOUNTER — Other Ambulatory Visit (HOSPITAL_BASED_OUTPATIENT_CLINIC_OR_DEPARTMENT_OTHER): Payer: Self-pay | Admitting: Family Medicine

## 2020-03-09 DIAGNOSIS — Z1231 Encounter for screening mammogram for malignant neoplasm of breast: Secondary | ICD-10-CM

## 2020-03-10 ENCOUNTER — Other Ambulatory Visit: Payer: Self-pay

## 2020-03-10 ENCOUNTER — Ambulatory Visit (INDEPENDENT_AMBULATORY_CARE_PROVIDER_SITE_OTHER): Payer: Medicare Other

## 2020-03-10 ENCOUNTER — Other Ambulatory Visit (INDEPENDENT_AMBULATORY_CARE_PROVIDER_SITE_OTHER): Payer: Medicare Other

## 2020-03-10 DIAGNOSIS — Z23 Encounter for immunization: Secondary | ICD-10-CM

## 2020-03-10 DIAGNOSIS — E039 Hypothyroidism, unspecified: Secondary | ICD-10-CM

## 2020-03-10 LAB — TSH: TSH: 4.06 mIU/L (ref 0.40–4.50)

## 2020-04-11 ENCOUNTER — Other Ambulatory Visit (HOSPITAL_BASED_OUTPATIENT_CLINIC_OR_DEPARTMENT_OTHER): Payer: Self-pay | Admitting: Internal Medicine

## 2020-04-11 ENCOUNTER — Ambulatory Visit (HOSPITAL_BASED_OUTPATIENT_CLINIC_OR_DEPARTMENT_OTHER)
Admission: RE | Admit: 2020-04-11 | Discharge: 2020-04-11 | Disposition: A | Discharge status: Home / Self Care | Payer: Medicare Other | Source: Ambulatory Visit | Attending: Family Medicine | Admitting: Family Medicine

## 2020-04-11 ENCOUNTER — Ambulatory Visit: Payer: Medicare Other | Attending: Internal Medicine

## 2020-04-11 ENCOUNTER — Encounter (HOSPITAL_BASED_OUTPATIENT_CLINIC_OR_DEPARTMENT_OTHER): Payer: Self-pay

## 2020-04-11 ENCOUNTER — Other Ambulatory Visit: Payer: Self-pay

## 2020-04-11 DIAGNOSIS — Z1231 Encounter for screening mammogram for malignant neoplasm of breast: Secondary | ICD-10-CM | POA: Diagnosis not present

## 2020-04-11 DIAGNOSIS — Z23 Encounter for immunization: Secondary | ICD-10-CM

## 2020-04-11 NOTE — Progress Notes (Signed)
   Covid-19 Vaccination [facility]  Name:  Carla Reilly    MRN: [id number] DOB: Nov 23, 1934  04/11/2020  Carla Reilly was observed post Covid-19 immunization for 15 minutes without incident. She was provided with Vaccine Information Sheet and instruction to access the V-Safe system.   Carla Reilly was instructed to call 911 with any severe reactions post vaccine: Marland Kitchen Difficulty breathing  . Swelling of face and throat  . A fast heartbeat  . A bad rash all over body  . Dizziness and weakness

## 2020-04-14 ENCOUNTER — Other Ambulatory Visit: Payer: Self-pay

## 2020-04-14 ENCOUNTER — Encounter: Payer: Self-pay | Admitting: Family Medicine

## 2020-04-14 ENCOUNTER — Ambulatory Visit (INDEPENDENT_AMBULATORY_CARE_PROVIDER_SITE_OTHER): Payer: Medicare Other | Admitting: Family Medicine

## 2020-04-14 DIAGNOSIS — E039 Hypothyroidism, unspecified: Secondary | ICD-10-CM

## 2020-04-14 DIAGNOSIS — I10 Essential (primary) hypertension: Secondary | ICD-10-CM | POA: Diagnosis not present

## 2020-04-14 DIAGNOSIS — E785 Hyperlipidemia, unspecified: Secondary | ICD-10-CM

## 2020-04-14 MED ORDER — LOSARTAN POTASSIUM-HCTZ 50-12.5 MG PO TABS: 1.0000 | ORAL_TABLET | Freq: Every day | ORAL | 3 refills | Status: DC

## 2020-04-14 NOTE — Progress Notes (Signed)
Patient ID: Carla Reilly, female    DOB: 1934/11/29  Age: 85 y.o. MRN: [id number]    Subjective:  Subjective  HPI Carla Reilly presents for f/u bp , chol   No complaints---her arm is a little red after her covid booster but she has no pain.  Pt has cpe scheduled for Jan.   Review of Systems  Constitutional: Negative for appetite change, diaphoresis, fatigue and unexpected weight change.  Eyes: Negative for pain, redness and visual disturbance.  Respiratory: Negative for cough, chest tightness, shortness of breath and wheezing.   Cardiovascular: Negative for chest pain, palpitations and leg swelling.  Endocrine: Negative for cold intolerance, heat intolerance, polydipsia, polyphagia and polyuria.  Genitourinary: Negative for difficulty urinating, dysuria and frequency.  Neurological: Negative for dizziness, light-headedness, numbness and headaches.    History Past Medical History:  Diagnosis Date  . Carpal tunnel syndrome   . Cataracts, bilateral   . Hyperlipidemia   . Thyroid disease    Hypothyroidism  . Wears dentures   . Wears glasses     She has a past surgical history that includes Tonsillectomy; Dilation and curettage of uterus (1980); Hysteroscopy with D & C (N/A, 03/16/2015); Total abdominal hysterectomy w/ bilateral salpingoophorectomy (Bilateral, 07/2015); and CATARACTS REMOVED OCT & NOV 2017.   Her family history includes Cancer in her father; Colon cancer (age of onset: 67) in her son; Dementia in her mother; Hypertension in her brother; Hypothyroidism in her mother; Prostate cancer in her father; Testicular cancer in her grandchild.She reports that she quit smoking about 47 years ago. She has never used smokeless tobacco. She reports that she does not drink alcohol and does not use drugs.  Current Outpatient Medications on File Prior to Visit  Medication Sig Dispense Refill  . Coenzyme Q10 (VITALINE COQ10 PO) Take 600 mg by mouth.    . EPINEPHrine 0.3 mg/0.3 mL IJ  SOAJ injection Inject 0.3 mLs (0.3 mg total) into the muscle once. 3 Device 0  . levothyroxine (SYNTHROID) 88 MCG tablet TAKE 1 TABLET BY MOUTH  DAILY BEFORE BREAKFAST 90 tablet 1  . Lutein 40 MG CAPS Take 1 capsule by mouth daily.    . simvastatin (ZOCOR) 40 MG tablet TAKE 1 TABLET BY MOUTH AT  BEDTIME 90 tablet 3  . Specialty Vitamins Products (RA EAR CARE) TABS Take by mouth as needed.      No current facility-administered medications on file prior to visit.     Objective:  Objective  Physical Exam Vitals and nursing note reviewed.  Constitutional:      Appearance: She is well-developed.  HENT:     Head: Normocephalic and atraumatic.  Eyes:     Conjunctiva/sclera: Conjunctivae normal.  Neck:     Thyroid: No thyromegaly.     Vascular: No carotid bruit or JVD.  Cardiovascular:     Rate and Rhythm: Normal rate and regular rhythm.     Heart sounds: Normal heart sounds. No murmur heard.   Pulmonary:     Effort: Pulmonary effort is normal. No respiratory distress.     Breath sounds: Normal breath sounds. No wheezing or rales.  Chest:     Chest wall: No tenderness.  Musculoskeletal:     Cervical back: Normal range of motion and neck supple.  Neurological:     Mental Status: She is alert and oriented to person, place, and time.    BP 120/70 (BP Location: Right Arm, Patient Position: Sitting, Cuff Size: Large)   Pulse 83  Temp 98 F (36.7 C) (Oral)   Resp 18   Ht 5' 4.5" (1.638 m)   Wt 202 lb 12.8 oz (92 kg)   SpO2 97%   BMI 34.27 kg/m  Wt Readings from Last 3 Encounters:  04/14/20 202 lb 12.8 oz (92 kg)  10/13/19 200 lb (90.7 kg)  06/30/19 208 lb (94.3 kg)     Lab Results  Component Value Date   WBC 5.9 05/20/2018   HGB 14.4 05/20/2018   HCT 42.6 05/20/2018   PLT 232.0 05/20/2018   GLUCOSE 104 (H) 12/08/2019   CHOL 132 12/08/2019   TRIG 137.0 12/08/2019   HDL 50.40 12/08/2019   LDLCALC 54 12/08/2019   ALT 25 12/08/2019   AST 21 12/08/2019   NA 141  12/08/2019   K 3.9 12/08/2019   CL 103 12/08/2019   CREATININE 0.92 12/08/2019   BUN 16 12/08/2019   CO2 27 12/08/2019   TSH 4.06 03/10/2020    MM 3D SCREEN BREAST BILATERAL  Result Date: 04/13/2020 CLINICAL DATA:  Screening. EXAM: DIGITAL SCREENING BILATERAL MAMMOGRAM WITH TOMO AND CAD COMPARISON:  Previous exam(s). ACR Breast Density Category b: There are scattered areas of fibroglandular density. FINDINGS: There are no findings suspicious for malignancy. Images were processed with CAD. IMPRESSION: No mammographic evidence of malignancy. A result letter of this screening mammogram will be mailed directly to the patient. RECOMMENDATION: Screening mammogram in one year. (Code:SM-B-01Y) BI-RADS CATEGORY  1: Negative. Electronically Signed   By: Claudie Revering M.D.   On: 04/13/2020 16:36     Assessment & Plan:  Plan  I am having [place] maintain her Coenzyme Q10 (VITALINE COQ10 PO), Lutein, RA Ear Care, EPINEPHrine, levothyroxine, simvastatin, and losartan-hydrochlorothiazide.  Meds ordered this encounter  Medications  . losartan-hydrochlorothiazide (HYZAAR) 50-12.5 MG tablet    Sig: Take 1 tablet by mouth daily.    Dispense:  90 tablet    Refill:  3    Problem List Items Addressed This Visit      Unprioritized   Essential hypertension   Relevant Medications   losartan-hydrochlorothiazide (HYZAAR) 50-12.5 MG tablet      Follow-up: Return if symptoms worsen or fail to improve, for [id number].  Ann Held, DO

## 2020-04-14 NOTE — Assessment & Plan Note (Signed)
Well controlled, no changes to meds. Encouraged heart healthy diet such as the DASH diet and exercise as tolerated.  con't losartan hct 

## 2020-04-14 NOTE — Assessment & Plan Note (Signed)
Tolerating statin, encouraged heart healthy diet, avoid trans fats, minimize simple carbs and saturated fats. Increase exercise as tolerated con't simvastatin Lab Results  Component Value Date   CHOL 132 12/08/2019   HDL 50.40 12/08/2019   LDLCALC 54 12/08/2019   TRIG 137.0 12/08/2019   CHOLHDL 3 12/08/2019

## 2020-04-14 NOTE — Assessment & Plan Note (Signed)
Lab Results  Component Value Date   TSH 4.06 03/10/2020    con't synthroid

## 2020-04-14 NOTE — Patient Instructions (Signed)

## 2020-04-15 MED FILL — PFIZER-BIONTECH COVID-19 VA: 30 | 1 days supply | Qty: 0 | Fill #0

## 2020-06-10 ENCOUNTER — Ambulatory Visit (INDEPENDENT_AMBULATORY_CARE_PROVIDER_SITE_OTHER): Payer: Medicare Other | Admitting: Family Medicine

## 2020-06-10 ENCOUNTER — Other Ambulatory Visit: Payer: Self-pay

## 2020-06-10 ENCOUNTER — Encounter: Payer: Self-pay | Admitting: Family Medicine

## 2020-06-10 VITALS — BP 120/70 | HR 79 | Temp 97.5°F | Ht 64.0 in | Wt 191.2 lb

## 2020-06-10 DIAGNOSIS — E785 Hyperlipidemia, unspecified: Secondary | ICD-10-CM

## 2020-06-10 DIAGNOSIS — I10 Essential (primary) hypertension: Secondary | ICD-10-CM | POA: Diagnosis not present

## 2020-06-10 DIAGNOSIS — E039 Hypothyroidism, unspecified: Secondary | ICD-10-CM | POA: Diagnosis not present

## 2020-06-10 LAB — CBC WITH DIFFERENTIAL/PLATELET
Basophils Absolute: 0 10*3/uL (ref 0.0–0.1)
Basophils Relative: 0.6 % (ref 0.0–3.0)
Eosinophils Absolute: 0.1 10*3/uL (ref 0.0–0.7)
Eosinophils Relative: 2.3 % (ref 0.0–5.0)
HCT: 42.4 % (ref 36.0–46.0)
Hemoglobin: 14.4 g/dL (ref 12.0–15.0)
Lymphocytes Relative: 34.8 % (ref 12.0–46.0)
Lymphs Abs: 1.9 10*3/uL (ref 0.7–4.0)
MCHC: 33.9 g/dL (ref 30.0–36.0)
MCV: 94.4 fl (ref 78.0–100.0)
Monocytes Absolute: 0.5 10*3/uL (ref 0.1–1.0)
Monocytes Relative: 8.8 % (ref 3.0–12.0)
Neutro Abs: 3 10*3/uL (ref 1.4–7.7)
Neutrophils Relative %: 53.5 % (ref 43.0–77.0)
Platelets: 216 10*3/uL (ref 150.0–400.0)
RBC: 4.49 Mil/uL (ref 3.87–5.11)
RDW: 13.1 % (ref 11.5–15.5)
WBC: 5.6 10*3/uL (ref 4.0–10.5)

## 2020-06-10 LAB — COMPREHENSIVE METABOLIC PANEL
ALT: 17 U/L (ref 0–35)
AST: 16 U/L (ref 0–37)
Albumin: 4.5 g/dL (ref 3.5–5.2)
Alkaline Phosphatase: 92 U/L (ref 39–117)
BUN: 18 mg/dL (ref 6–23)
CO2: 30 mEq/L (ref 19–32)
Calcium: 10.1 mg/dL (ref 8.4–10.5)
Chloride: 104 mEq/L (ref 96–112)
Creatinine, Ser: 0.96 mg/dL (ref 0.40–1.20)
GFR: 54.84 mL/min — ABNORMAL LOW (ref >60.00)
Glucose, Bld: 101 mg/dL — ABNORMAL HIGH (ref 70–99)
Potassium: 4.7 mEq/L (ref 3.5–5.1)
Sodium: 141 mEq/L (ref 135–145)
Total Bilirubin: 0.6 mg/dL (ref 0.2–1.2)
Total Protein: 7.1 g/dL (ref 6.0–8.3)

## 2020-06-10 LAB — TSH: TSH: 3.24 u[IU]/mL (ref 0.35–4.50)

## 2020-06-10 LAB — LIPID PANEL
Cholesterol: 165 mg/dL (ref 0–200)
HDL: 64.4 mg/dL (ref >39.00)
LDL Cholesterol: 78 mg/dL (ref 0–99)
NonHDL: 100.47
Total CHOL/HDL Ratio: 3
Triglycerides: 112 mg/dL (ref 0.0–149.0)
VLDL: 22.4 mg/dL (ref 0.0–40.0)

## 2020-06-10 NOTE — Progress Notes (Signed)
Subjective:     Carla Reilly is a 86 y.o. female and is here for a comprehensive physical exam. The patient reports no problems.  Social History   Socioeconomic History  . Marital status: Widowed    Spouse name: Not on file  . Number of children: 2  . Years of education: 58  . Highest education level: Not on file  Occupational History    Employer: [employer]    Comment: retired 12/2014  Tobacco Use  . Smoking status: Former Smoker    Quit date: 06/04/1972    Years since quitting: 48.0  . Smokeless tobacco: Never Used  Substance and Sexual Activity  . Alcohol use: No  . Drug use: No  . Sexual activity: Yes    Partners: Male  Other Topics Concern  . Not on file  Social History Narrative   Pt lives alone in a house    Social Determinants of Health   Financial Resource Strain: Not on file  Food Insecurity: Not on file  Transportation Needs: Not on file  Physical Activity: Not on file  Stress: Not on file  Social Connections: Not on file  Intimate Partner Violence: Not on file   Health Maintenance  Topic Date Due  . COVID-19 Vaccine (4 - Booster for Pfizer series) 10/09/2020  . MAMMOGRAM  04/11/2021  . TETANUS/TDAP  10/17/2023  . INFLUENZA VACCINE  Completed  . DEXA SCAN  Completed  . PNA vac Low Risk Adult  Completed    The following portions of the patient's history were reviewed and updated as appropriate:  She  has a past medical history of Carpal tunnel syndrome, Cataracts, bilateral, Hyperlipidemia, Thyroid disease, Wears dentures, and Wears glasses. She does not have any pertinent problems on file. She  has a past surgical history that includes Tonsillectomy; Dilation and curettage of uterus (1980); Hysteroscopy with D & C (N/A, 03/16/2015); Total abdominal hysterectomy w/ bilateral salpingoophorectomy (Bilateral, 07/2015); and CATARACTS REMOVED OCT & NOV 2017. Her family history includes Cancer in her father; Colon cancer (age of onset: 46) in her son; Dementia  in her mother; Hypertension in her brother; Hypothyroidism in her mother; Lung cancer in her sister; Prostate cancer in her father; Testicular cancer in her grandchild. She  reports that she quit smoking about 48 years ago. She has never used smokeless tobacco. She reports that she does not drink alcohol and does not use drugs. She has a current medication list which includes the following prescription(s): coenzyme q10, epinephrine, levothyroxine, losartan-hydrochlorothiazide, lutein, simvastatin, and ra ear care. Current Outpatient Medications on File Prior to Visit  Medication Sig Dispense Refill  . Coenzyme Q10 (VITALINE COQ10 PO) Take 600 mg by mouth.    . EPINEPHrine 0.3 mg/0.3 mL IJ SOAJ injection Inject 0.3 mLs (0.3 mg total) into the muscle once. 3 Device 0  . levothyroxine (SYNTHROID) 88 MCG tablet TAKE 1 TABLET BY MOUTH  DAILY BEFORE BREAKFAST 90 tablet 1  . losartan-hydrochlorothiazide (HYZAAR) 50-12.5 MG tablet Take 1 tablet by mouth daily. 90 tablet 3  . Lutein 40 MG CAPS Take 1 capsule by mouth daily.    . simvastatin (ZOCOR) 40 MG tablet TAKE 1 TABLET BY MOUTH AT  BEDTIME 90 tablet 3  . Specialty Vitamins Products (RA EAR CARE) TABS Take by mouth as needed.      No current facility-administered medications on file prior to visit.   She is allergic to yellow jacket venom [bee venom] and zestoretic [lisinopril-hydrochlorothiazide]..  Review of Systems Review  of Systems  Constitutional: Negative for activity change, appetite change and fatigue.  HENT: Negative for hearing loss, congestion, tinnitus and ear discharge.  dentist q26m Eyes: Negative for visual disturbance (see optho q1y -- vision corrected to 20/20 with glasses).  Respiratory: Negative for cough, chest tightness and shortness of breath.   Cardiovascular: Negative for chest pain, palpitations and leg swelling.  Gastrointestinal: Negative for abdominal pain, diarrhea, constipation and abdominal distention.   Genitourinary: Negative for urgency, frequency, decreased urine volume and difficulty urinating.  Musculoskeletal: Negative for back pain, arthralgias and gait problem.  Skin: Negative for color change, pallor and rash.  Neurological: Negative for dizziness, light-headedness, numbness and headaches.  Hematological: Negative for adenopathy. Does not bruise/bleed easily.  Psychiatric/Behavioral: Negative for suicidal ideas, confusion, sleep disturbance, self-injury, dysphoric mood, decreased concentration and agitation.       Objective:    BP 120/70 (BP Location: Right Arm, Patient Position: Sitting, Cuff Size: Large)   Pulse 79   Temp (!) 97.5 F (36.4 C) (Oral)   Ht 5\' 4"  (1.626 m)   Wt 191 lb 3.2 oz (86.7 kg)   SpO2 99%   BMI 32.82 kg/m  General appearance: alert, cooperative, appears stated age and no distress Head: Normocephalic, without obvious abnormality, atraumatic Eyes: negative findings: lids and lashes normal, conjunctivae and sclerae normal and pupils equal, round, reactive to light and accomodation Ears: normal TM's and external ear canals both ears Neck: no adenopathy, no carotid bruit, no JVD, supple, symmetrical, trachea midline and thyroid not enlarged, symmetric, no tenderness/mass/nodules Back: symmetric, no curvature. ROM normal. No CVA tenderness. Lungs: clear to auscultation bilaterally Breasts: deferred -- pt preference Heart: regular rate and rhythm, S1, S2 normal, no murmur, click, rub or gallop Abdomen: soft, non-tender; bowel sounds normal; no masses,  no organomegaly Pelvic: not indicated; status post hysterectomy, negative ROS Extremities: extremities normal, atraumatic, no cyanosis or edema Pulses: 2+ and symmetric Skin: Skin color, texture, turgor normal. No rashes or lesions Lymph nodes: Cervical, supraclavicular, and axillary nodes normal. Neurologic: Alert and oriented X 3, normal strength and tone. Normal symmetric reflexes. Normal coordination  and gait    Assessment:    Healthy female exam.      Plan:    ghm utd Check labs  See After Visit Summary for Counseling Recommendations    1. Hypothyroidism, unspecified type Check labs con't synthroid  - Lipid panel - CBC with Differential/Platelet - TSH - Comprehensive metabolic panel  2. Hyperlipidemia, unspecified hyperlipidemia type Tolerating statin, encouraged heart healthy diet, avoid trans fats, minimize simple carbs and saturated fats. Increase exercise as tolerated - Lipid panel - CBC with Differential/Platelet - TSH - Comprehensive metabolic panel  3. Primary hypertension Well controlled, no changes to meds. Encouraged heart healthy diet such as the DASH diet and exercise as tolerated.  - Lipid panel - CBC with Differential/Platelet - TSH - Comprehensive metabolic panel

## 2020-06-10 NOTE — Patient Instructions (Signed)
Preventive Care 84 Years and Older, Female Preventive care refers to lifestyle choices and visits with your health care provider that can promote health and wellness. This includes:  A yearly physical exam. This is also called an annual well check.  Regular dental and eye exams.  Immunizations.  Screening for certain conditions.  Healthy lifestyle choices, such as diet and exercise. What can I expect for my preventive care visit? Physical exam Your health care provider will check:  Height and weight. These may be used to calculate body mass index (BMI), which is a measurement that tells if you are at a healthy weight.  Heart rate and blood pressure.  Your skin for abnormal spots. Counseling Your health care provider may ask you questions about:  Alcohol, tobacco, and drug use.  Emotional well-being.  Home and relationship well-being.  Sexual activity.  Eating habits.  History of falls.  Memory and ability to understand (cognition).  Work and work Statistician.  Pregnancy and menstrual history. What immunizations do I need?  Influenza (flu) vaccine  This is recommended every year. Tetanus, diphtheria, and pertussis (Tdap) vaccine  You may need a Td booster every 10 years. Varicella (chickenpox) vaccine  You may need this vaccine if you have not already been vaccinated. Zoster (shingles) vaccine  You may need this after age 55. Pneumococcal conjugate (PCV13) vaccine  One dose is recommended after age 29. Pneumococcal polysaccharide (PPSV23) vaccine  One dose is recommended after age 69. Measles, mumps, and rubella (MMR) vaccine  You may need at least one dose of MMR if you were born in 1957 or later. You may also need a second dose. Meningococcal conjugate (MenACWY) vaccine  You may need this if you have certain conditions. Hepatitis A vaccine  You may need this if you have certain conditions or if you travel or work in places where you may be exposed  to hepatitis A. Hepatitis B vaccine  You may need this if you have certain conditions or if you travel or work in places where you may be exposed to hepatitis B. Haemophilus influenzae type b (Hib) vaccine  You may need this if you have certain conditions. You may receive vaccines as individual doses or as more than one vaccine together in one shot (combination vaccines). Talk with your health care provider about the risks and benefits of combination vaccines. What tests do I need? Blood tests  Lipid and cholesterol levels. These may be checked every 5 years, or more frequently depending on your overall health.  Hepatitis C test.  Hepatitis B test. Screening  Lung cancer screening. You may have this screening every year starting at age 16 if you have a 30-pack-year history of smoking and currently smoke or have quit within the past 15 years.  Colorectal cancer screening. All adults should have this screening starting at age 30 and continuing until age 3. Your health care provider may recommend screening at age 76 if you are at increased risk. You will have tests every 1-10 years, depending on your results and the type of screening test.  Diabetes screening. This is done by checking your blood sugar (glucose) after you have not eaten for a while (fasting). You may have this done every 1-3 years.  Mammogram. This may be done every 1-2 years. Talk with your health care provider about how often you should have regular mammograms.  BRCA-related cancer screening. This may be done if you have a family history of breast, ovarian, tubal, or peritoneal cancers.  Other tests  Sexually transmitted disease (STD) testing.  Bone density scan. This is done to screen for osteoporosis. You may have this done starting at age 82. Follow these instructions at home: Eating and drinking  Eat a diet that includes fresh fruits and vegetables, whole grains, lean protein, and low-fat dairy products. Limit  your intake of foods with high amounts of sugar, saturated fats, and salt.  Take vitamin and mineral supplements as recommended by your health care provider.  Do not drink alcohol if your health care provider tells you not to drink.  If you drink alcohol: ? Limit how much you have to 0-1 drink a day. ? Be aware of how much alcohol is in your drink. In the [place] one drink equals one 12 oz bottle of beer (355 mL), one 5 oz glass of wine (148 mL), or one 1 oz glass of hard liquor (44 mL). Lifestyle  Take daily care of your teeth and gums.  Stay active. Exercise for at least 30 minutes on 5 or more days each week.  Do not use any products that contain nicotine or tobacco, such as cigarettes, e-cigarettes, and chewing tobacco. If you need help quitting, ask your health care provider.  If you are sexually active, practice safe sex. Use a condom or other form of protection in order to prevent STIs (sexually transmitted infections).  Talk with your health care provider about taking a low-dose aspirin or statin. What's next?  Go to your health care provider once a year for a well check visit.  Ask your health care provider how often you should have your eyes and teeth checked.  Stay up to date on all vaccines. This information is not intended to replace advice given to you by your health care provider. Make sure you discuss any questions you have with your health care provider. Document Revised: 05/15/2018 Document Reviewed: 05/15/2018 Elsevier Patient Education  2020 [employer].

## 2020-07-05 ENCOUNTER — Other Ambulatory Visit: Payer: Self-pay | Admitting: Family Medicine

## 2020-10-14 ENCOUNTER — Other Ambulatory Visit: Payer: Self-pay

## 2020-10-14 DIAGNOSIS — I10 Essential (primary) hypertension: Secondary | ICD-10-CM

## 2020-10-14 MED ORDER — LOSARTAN POTASSIUM-HCTZ 50-12.5 MG PO TABS: 1.0000 | ORAL_TABLET | Freq: Every day | ORAL | 0 refills | Status: DC

## 2020-12-08 ENCOUNTER — Encounter: Payer: Self-pay | Admitting: Family Medicine

## 2020-12-08 ENCOUNTER — Other Ambulatory Visit: Payer: Self-pay

## 2020-12-08 ENCOUNTER — Ambulatory Visit (INDEPENDENT_AMBULATORY_CARE_PROVIDER_SITE_OTHER): Payer: Medicare Other | Admitting: Family Medicine

## 2020-12-08 VITALS — BP 122/60 | HR 90 | Temp 97.9°F | Resp 18 | Ht 64.0 in | Wt 204.2 lb

## 2020-12-08 DIAGNOSIS — E039 Hypothyroidism, unspecified: Secondary | ICD-10-CM

## 2020-12-08 DIAGNOSIS — E785 Hyperlipidemia, unspecified: Secondary | ICD-10-CM | POA: Diagnosis not present

## 2020-12-08 DIAGNOSIS — I10 Essential (primary) hypertension: Secondary | ICD-10-CM

## 2020-12-08 LAB — LIPID PANEL
Cholesterol: 150 mg/dL (ref 0–200)
HDL: 62.7 mg/dL (ref >39.00)
LDL Cholesterol: 66 mg/dL (ref 0–99)
NonHDL: 87.68
Total CHOL/HDL Ratio: 2
Triglycerides: 106 mg/dL (ref 0.0–149.0)
VLDL: 21.2 mg/dL (ref 0.0–40.0)

## 2020-12-08 LAB — TSH: TSH: 2.59 u[IU]/mL (ref 0.35–5.50)

## 2020-12-08 LAB — COMPREHENSIVE METABOLIC PANEL
ALT: 18 U/L (ref 0–35)
AST: 15 U/L (ref 0–37)
Albumin: 4.2 g/dL (ref 3.5–5.2)
Alkaline Phosphatase: 87 U/L (ref 39–117)
BUN: 19 mg/dL (ref 6–23)
CO2: 27 mEq/L (ref 19–32)
Calcium: 9.5 mg/dL (ref 8.4–10.5)
Chloride: 103 mEq/L (ref 96–112)
Creatinine, Ser: 0.93 mg/dL (ref 0.40–1.20)
GFR: 56.77 mL/min — ABNORMAL LOW (ref >60.00)
Glucose, Bld: 99 mg/dL (ref 70–99)
Potassium: 3.7 mEq/L (ref 3.5–5.1)
Sodium: 140 mEq/L (ref 135–145)
Total Bilirubin: 0.6 mg/dL (ref 0.2–1.2)
Total Protein: 6.7 g/dL (ref 6.0–8.3)

## 2020-12-08 NOTE — Patient Instructions (Signed)
https://www.nhlbi.nih.gov/files/docs/public/heart/dash_brief.pdf">  DASH Eating Plan DASH stands for Dietary Approaches to Stop Hypertension. The DASH eating plan is a healthy eating plan that has been shown to: Reduce high blood pressure (hypertension). Reduce your risk for type 2 diabetes, heart disease, and stroke. Help with weight loss. What are tips for following this plan? Reading food labels Check food labels for the amount of salt (sodium) per serving. Choose foods with less than 5 percent of the Daily Value of sodium. Generally, foods with less than 300 milligrams (mg) of sodium per serving fit into this eating plan. To find whole grains, look for the word "whole" as the first word in the ingredient list. Shopping Buy products labeled as "low-sodium" or "no salt added." Buy fresh foods. Avoid canned foods and pre-made or frozen meals. Cooking Avoid adding salt when cooking. Use salt-free seasonings or herbs instead of table salt or sea salt. Check with your health care provider or pharmacist before using salt substitutes. Do not fry foods. Cook foods using healthy methods such as baking, boiling, grilling, roasting, and broiling instead. Cook with heart-healthy oils, such as olive, canola, avocado, soybean, or sunflower oil. Meal planning  Eat a balanced diet that includes: 4 or more servings of fruits and 4 or more servings of vegetables each day. Try to fill one-half of your plate with fruits and vegetables. 6-8 servings of whole grains each day. Less than 6 oz (170 g) of lean meat, poultry, or fish each day. A 3-oz (85-g) serving of meat is about the same size as a deck of cards. One egg equals 1 oz (28 g). 2-3 servings of low-fat dairy each day. One serving is 1 cup (237 mL). 1 serving of nuts, seeds, or beans 5 times each week. 2-3 servings of heart-healthy fats. Healthy fats called omega-3 fatty acids are found in foods such as walnuts, flaxseeds, fortified milks, and eggs.  These fats are also found in cold-water fish, such as sardines, salmon, and mackerel. Limit how much you eat of: Canned or prepackaged foods. Food that is high in trans fat, such as some fried foods. Food that is high in saturated fat, such as fatty meat. Desserts and other sweets, sugary drinks, and other foods with added sugar. Full-fat dairy products. Do not salt foods before eating. Do not eat more than 4 egg yolks a week. Try to eat at least 2 vegetarian meals a week. Eat more home-cooked food and less restaurant, buffet, and fast food.  Lifestyle When eating at a restaurant, ask that your food be prepared with less salt or no salt, if possible. If you drink alcohol: Limit how much you use to: 0-1 drink a day for women who are not pregnant. 0-2 drinks a day for men. Be aware of how much alcohol is in your drink. In the [place] one drink equals one 12 oz bottle of beer (355 mL), one 5 oz glass of wine (148 mL), or one 1 oz glass of hard liquor (44 mL). General information Avoid eating more than 2,300 mg of salt a day. If you have hypertension, you may need to reduce your sodium intake to 1,500 mg a day. Work with your health care provider to maintain a healthy body weight or to lose weight. Ask what an ideal weight is for you. Get at least 30 minutes of exercise that causes your heart to beat faster (aerobic exercise) most days of the week. Activities may include walking, swimming, or biking. Work with your health care provider   or dietitian to adjust your eating plan to your individual calorie needs. What foods should I eat? Fruits All fresh, dried, or frozen fruit. Canned fruit in natural juice (without addedsugar). Vegetables Fresh or frozen vegetables (raw, steamed, roasted, or grilled). Low-sodium or reduced-sodium tomato and vegetable juice. Low-sodium or reduced-sodium tomatosauce and tomato paste. Low-sodium or reduced-sodium canned vegetables. Grains Whole-grain or  whole-wheat bread. Whole-grain or whole-wheat pasta. Brown rice. Oatmeal. Quinoa. Bulgur. Whole-grain and low-sodium cereals. Pita bread.Low-fat, low-sodium crackers. Whole-wheat flour tortillas. Meats and other proteins Skinless chicken or turkey. Ground chicken or turkey. Pork with fat trimmed off. Fish and seafood. Egg whites. Dried beans, peas, or lentils. Unsalted nuts, nut butters, and seeds. Unsalted canned beans. Lean cuts of beef with fat trimmed off. Low-sodium, lean precooked or cured meat, such as sausages or meatloaves. Dairy Low-fat (1%) or fat-free (skim) milk. Reduced-fat, low-fat, or fat-free cheeses. Nonfat, low-sodium ricotta or cottage cheese. Low-fat or nonfatyogurt. Low-fat, low-sodium cheese. Fats and oils Soft margarine without trans fats. Vegetable oil. Reduced-fat, low-fat, or light mayonnaise and salad dressings (reduced-sodium). Canola, safflower, olive, avocado, soybean, andsunflower oils. Avocado. Seasonings and condiments Herbs. Spices. Seasoning mixes without salt. Other foods Unsalted popcorn and pretzels. Fat-free sweets. The items listed above may not be a complete list of foods and beverages you can eat. Contact a dietitian for more information. What foods should I avoid? Fruits Canned fruit in a light or heavy syrup. Fried fruit. Fruit in cream or buttersauce. Vegetables Creamed or fried vegetables. Vegetables in a cheese sauce. Regular canned vegetables (not low-sodium or reduced-sodium). Regular canned tomato sauce and paste (not low-sodium or reduced-sodium). Regular tomato and vegetable juice(not low-sodium or reduced-sodium). Pickles. Olives. Grains Baked goods made with fat, such as croissants, muffins, or some breads. Drypasta or rice meal packs. Meats and other proteins Fatty cuts of meat. Ribs. Fried meat. Bacon. Bologna, salami, and other precooked or cured meats, such as sausages or meat loaves. Fat from the back of a pig (fatback). Bratwurst.  Salted nuts and seeds. Canned beans with added salt. Canned orsmoked fish. Whole eggs or egg yolks. Chicken or turkey with skin. Dairy Whole or 2% milk, cream, and half-and-half. Whole or full-fat cream cheese. Whole-fat or sweetened yogurt. Full-fat cheese. Nondairy creamers. Whippedtoppings. Processed cheese and cheese spreads. Fats and oils Butter. Stick margarine. Lard. Shortening. Ghee. Bacon fat. Tropical oils, suchas coconut, palm kernel, or palm oil. Seasonings and condiments Onion salt, garlic salt, seasoned salt, table salt, and sea salt. Worcestershire sauce. Tartar sauce. Barbecue sauce. Teriyaki sauce. Soy sauce, including reduced-sodium. Steak sauce. Canned and packaged gravies. Fish sauce. Oyster sauce. Cocktail sauce. Store-bought horseradish. Ketchup. Mustard. Meat flavorings and tenderizers. Bouillon cubes. Hot sauces. Pre-made or packaged marinades. Pre-made or packaged taco seasonings. Relishes. Regular saladdressings. Other foods Salted popcorn and pretzels. The items listed above may not be a complete list of foods and beverages you should avoid. Contact a dietitian for more information. Where to find more information National Heart, Lung, and Blood Institute: www.nhlbi.nih.gov American Heart Association: www.heart.org Academy of Nutrition and Dietetics: www.eatright.org National Kidney Foundation: www.kidney.org Summary The DASH eating plan is a healthy eating plan that has been shown to reduce high blood pressure (hypertension). It may also reduce your risk for type 2 diabetes, heart disease, and stroke. When on the DASH eating plan, aim to eat more fresh fruits and vegetables, whole grains, lean proteins, low-fat dairy, and heart-healthy fats. With the DASH eating plan, you should limit salt (sodium) intake to 2,300   mg a day. If you have hypertension, you may need to reduce your sodium intake to 1,500 mg a day. Work with your health care provider or dietitian to adjust  your eating plan to your individual calorie needs. This information is not intended to replace advice given to you by your health care provider. Make sure you discuss any questions you have with your healthcare provider. Document Revised: 04/24/2019 Document Reviewed: 04/24/2019 Elsevier Patient Education  2022 [employer]  

## 2020-12-08 NOTE — Assessment & Plan Note (Signed)
Stable Check labs today con't meds

## 2020-12-08 NOTE — Assessment & Plan Note (Signed)
Well controlled, no changes to meds. Encouraged heart healthy diet such as the DASH diet and exercise as tolerated.  °

## 2020-12-08 NOTE — Progress Notes (Signed)
Subjective:   By signing my name below, I, Shehryar Baig, attest that this documentation has been prepared under the direction and in the presence of Dr. Roma Schanz, DO. 12/08/2020      Patient ID: Carla Reilly, female    DOB: 1934/08/23, 86 y.o.   MRN: [id number]  Chief Complaint  Patient presents with   Hypertension   Hyperlipidemia   Follow-up    HPI Patient is in today for a office visit. Her blood pressure is well controled at this time. Otherwise she is doing well during this visit. BP Readings from Last 3 Encounters:  12/08/20 122/60  06/10/20 120/70  04/14/20 120/70   She has 4 Covid-19 vaccines at this time and has her Covid-19 vaccine card with her during this visit. Her son recently passed away from colon cancer in January, 2022. Her grandmother and grandfather both also passed away from colon cancer.  Past Medical History:  Diagnosis Date   Carpal tunnel syndrome    Cataracts, bilateral    Hyperlipidemia    Thyroid disease    Hypothyroidism   Wears dentures    Wears glasses     Past Surgical History:  Procedure Laterality Date   CATARACTS REMOVED OCT & NOV 2017     OCT & NOV 2017   DILATION AND CURETTAGE OF UTERUS  1980   HYSTEROSCOPY WITH D & C N/A 03/16/2015   Procedure: DILATATION AND CURETTAGE /HYSTEROSCOPY;  Surgeon: Maisie Fus, MD;  Location: [facility];  Service: Gynecology;  Laterality: N/A;   TONSILLECTOMY     TOTAL ABDOMINAL HYSTERECTOMY W/ BILATERAL SALPINGOOPHORECTOMY Bilateral 07/2015   Dr Hurley Cisco    Family History  Problem Relation Age of Onset   Hypothyroidism Mother    Dementia Mother        died at 49yo   Prostate cancer Father    Cancer Father        prostate   Lung cancer Sister    Hypertension Brother    Colon cancer Son 51   Testicular cancer Grandchild     Social History   Socioeconomic History   Marital status: Widowed    Spouse name: Not on file   Number of children: 2   Years of  education: 75   Highest education level: Not on file  Occupational History    Employer: [employer]    Comment: retired 12/2014  Tobacco Use   Smoking status: Former    Pack years: 0.00    Types: Cigarettes    Quit date: 06/04/1972    Years since quitting: 48.5   Smokeless tobacco: Never  Substance and Sexual Activity   Alcohol use: No   Drug use: No   Sexual activity: Yes    Partners: Male  Other Topics Concern   Not on file  Social History Narrative   Pt lives alone in a house    Social Determinants of Health   Financial Resource Strain: Not on file  Food Insecurity: Not on file  Transportation Needs: Not on file  Physical Activity: Not on file  Stress: Not on file  Social Connections: Not on file  Intimate Partner Violence: Not on file    Outpatient Medications Prior to Visit  Medication Sig Dispense Refill   Coenzyme Q10 (VITALINE COQ10 PO) Take 600 mg by mouth.     COVID-19 mRNA vaccine, Pfizer, 30 MCG/0.3ML injection INJECT AS DIRECTED .3 mL 0   EPINEPHrine 0.3 mg/0.3 mL IJ SOAJ injection Inject 0.3  mLs (0.3 mg total) into the muscle once. 3 Device 0   levothyroxine (SYNTHROID) 88 MCG tablet Take 1 tablet (88 mcg total) by mouth daily before breakfast. 90 tablet 1   losartan-hydrochlorothiazide (HYZAAR) 50-12.5 MG tablet Take 1 tablet by mouth daily. 90 tablet 0   Lutein 40 MG CAPS Take 1 capsule by mouth daily.     simvastatin (ZOCOR) 40 MG tablet TAKE 1 TABLET BY MOUTH AT  BEDTIME 90 tablet 3   Specialty Vitamins Products (RA EAR CARE) TABS Take by mouth as needed.      No facility-administered medications prior to visit.    Allergies  Allergen Reactions   Yellow Jacket Venom [Bee Venom] Hives   Zestoretic [Lisinopril-Hydrochlorothiazide] Cough    Review of Systems  Constitutional:  Negative for chills, fever and malaise/fatigue.  HENT:  Negative for congestion and hearing loss.   Eyes:  Negative for discharge.  Respiratory:  Negative for cough, sputum  production and shortness of breath.   Cardiovascular:  Negative for chest pain, palpitations and leg swelling.  Gastrointestinal:  Negative for abdominal pain, blood in stool, constipation, diarrhea, heartburn, nausea and vomiting.  Genitourinary:  Negative for dysuria, frequency, hematuria and urgency.  Musculoskeletal:  Negative for back pain, falls and myalgias.  Skin:  Negative for rash.  Neurological:  Negative for dizziness, sensory change, loss of consciousness, weakness and headaches.  Endo/Heme/Allergies:  Negative for environmental allergies. Does not bruise/bleed easily.  Psychiatric/Behavioral:  Negative for depression and suicidal ideas. The patient is not nervous/anxious and does not have insomnia.       Objective:    Physical Exam Vitals and nursing note reviewed.  Constitutional:      General: She is not in acute distress.    Appearance: Normal appearance. She is not ill-appearing.  HENT:     Head: Normocephalic and atraumatic.     Right Ear: External ear normal.     Left Ear: External ear normal.  Eyes:     Extraocular Movements: Extraocular movements intact.     Pupils: Pupils are equal, round, and reactive to light.  Cardiovascular:     Rate and Rhythm: Normal rate and regular rhythm.     Pulses: Normal pulses.     Heart sounds: Normal heart sounds. No murmur heard.   No gallop.  Pulmonary:     Effort: Pulmonary effort is normal. No respiratory distress.     Breath sounds: Normal breath sounds. No wheezing, rhonchi or rales.  Skin:    General: Skin is warm and dry.  Neurological:     Mental Status: She is alert and oriented to person, place, and time.  Psychiatric:        Behavior: Behavior normal.    BP 122/60 (BP Location: Right Arm, Patient Position: Sitting, Cuff Size: Large)   Pulse 90   Temp 97.9 F (36.6 C) (Oral)   Resp 18   Ht 5\' 4"  (1.626 m)   Wt 204 lb 3.2 oz (92.6 kg)   SpO2 98%   BMI 35.05 kg/m  Wt Readings from Last 3 Encounters:   12/08/20 204 lb 3.2 oz (92.6 kg)  06/10/20 191 lb 3.2 oz (86.7 kg)  04/14/20 202 lb 12.8 oz (92 kg)    Diabetic Foot Exam - Simple   No data filed    Lab Results  Component Value Date   WBC 5.6 06/10/2020   HGB 14.4 06/10/2020   HCT 42.4 06/10/2020   PLT 216.0 06/10/2020   GLUCOSE 101 (  H) 06/10/2020   CHOL 165 06/10/2020   TRIG 112.0 06/10/2020   HDL 64.40 06/10/2020   LDLCALC 78 06/10/2020   ALT 17 06/10/2020   AST 16 06/10/2020   NA 141 06/10/2020   K 4.7 06/10/2020   CL 104 06/10/2020   CREATININE 0.96 06/10/2020   BUN 18 06/10/2020   CO2 30 06/10/2020   TSH 3.24 06/10/2020    Lab Results  Component Value Date   TSH 3.24 06/10/2020   Lab Results  Component Value Date   WBC 5.6 06/10/2020   HGB 14.4 06/10/2020   HCT 42.4 06/10/2020   MCV 94.4 06/10/2020   PLT 216.0 06/10/2020   Lab Results  Component Value Date   NA 141 06/10/2020   K 4.7 06/10/2020   CO2 30 06/10/2020   GLUCOSE 101 (H) 06/10/2020   BUN 18 06/10/2020   CREATININE 0.96 06/10/2020   BILITOT 0.6 06/10/2020   ALKPHOS 92 06/10/2020   AST 16 06/10/2020   ALT 17 06/10/2020   PROT 7.1 06/10/2020   ALBUMIN 4.5 06/10/2020   CALCIUM 10.1 06/10/2020   GFR 54.84 (L) 06/10/2020   Lab Results  Component Value Date   CHOL 165 06/10/2020   Lab Results  Component Value Date   HDL 64.40 06/10/2020   Lab Results  Component Value Date   LDLCALC 78 06/10/2020   Lab Results  Component Value Date   TRIG 112.0 06/10/2020   Lab Results  Component Value Date   CHOLHDL 3 06/10/2020   No results found for: HGBA1C     Assessment & Plan:   Problem List Items Addressed This Visit       Unprioritized   Hyperlipidemia - Primary   Relevant Orders   Lipid panel   Comprehensive metabolic panel   Hypothyroidism   Relevant Orders   TSH   Essential hypertension    Well controlled, no changes to meds. Encouraged heart healthy diet such as the DASH diet and exercise as tolerated.         Other Visit Diagnoses     Primary hypertension       Relevant Orders   Lipid panel   Comprehensive metabolic panel        No orders of the defined types were placed in this encounter.   I, Dr. Roma Schanz, DO, personally preformed the services described in this documentation.  All medical record entries made by the scribe were at my direction and in my presence.  I have reviewed the chart and discharge instructions (if applicable) and agree that the record reflects my personal performance and is accurate and complete. 12/08/2020   I,Shehryar Baig,acting as a scribe for Ann Held, DO.,have documented all relevant documentation on the behalf of Ann Held, DO,as directed by  Ann Held, DO while in the presence of Ann Held, DO.   Ann Held, DO

## 2020-12-08 NOTE — Assessment & Plan Note (Signed)
Encourage heart healthy diet such as MIND or DASH diet, increase exercise, avoid trans fats, simple carbohydrates and processed foods, consider a krill or fish or flaxseed oil cap daily.  °

## 2020-12-12 ENCOUNTER — Ambulatory Visit: Payer: Medicare Other | Admitting: Family Medicine

## 2021-01-16 ENCOUNTER — Other Ambulatory Visit: Payer: Self-pay | Admitting: Family Medicine

## 2021-01-16 DIAGNOSIS — E785 Hyperlipidemia, unspecified: Secondary | ICD-10-CM

## 2021-02-03 ENCOUNTER — Other Ambulatory Visit: Payer: Self-pay | Admitting: Family Medicine

## 2021-02-09 IMAGING — MG DIGITAL SCREENING BILAT W/ TOMO W/ CAD
8 series · 8 of 24 positions shown · non-contrast
Comparison: Previous exam(s).

CLINICAL DATA: Screening.

EXAM:
DIGITAL SCREENING BILATERAL MAMMOGRAM WITH TOMO AND CAD

[R CC synth-2D]
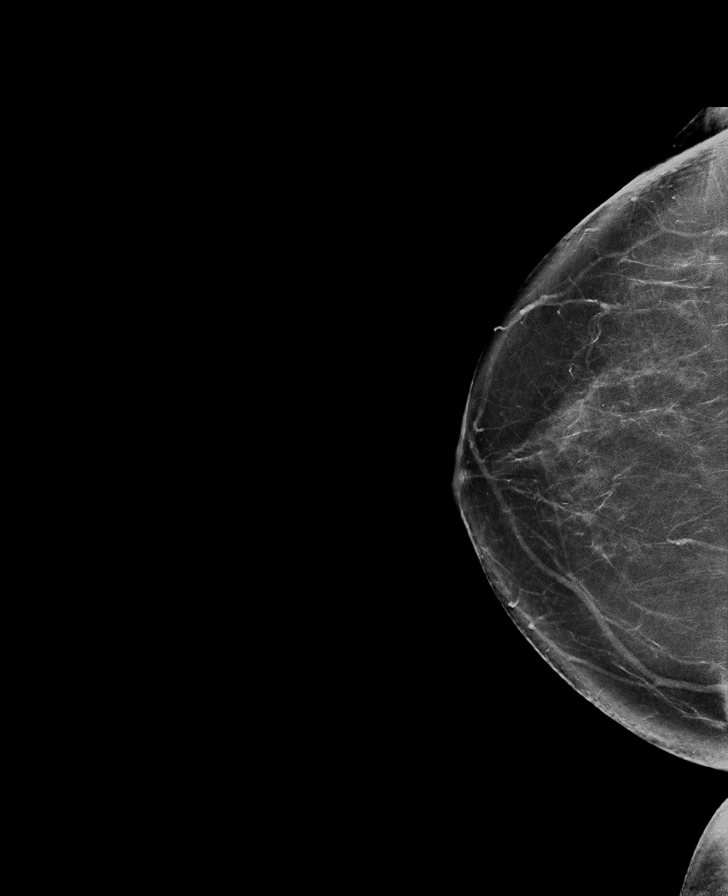

[L MLO synth-2D]
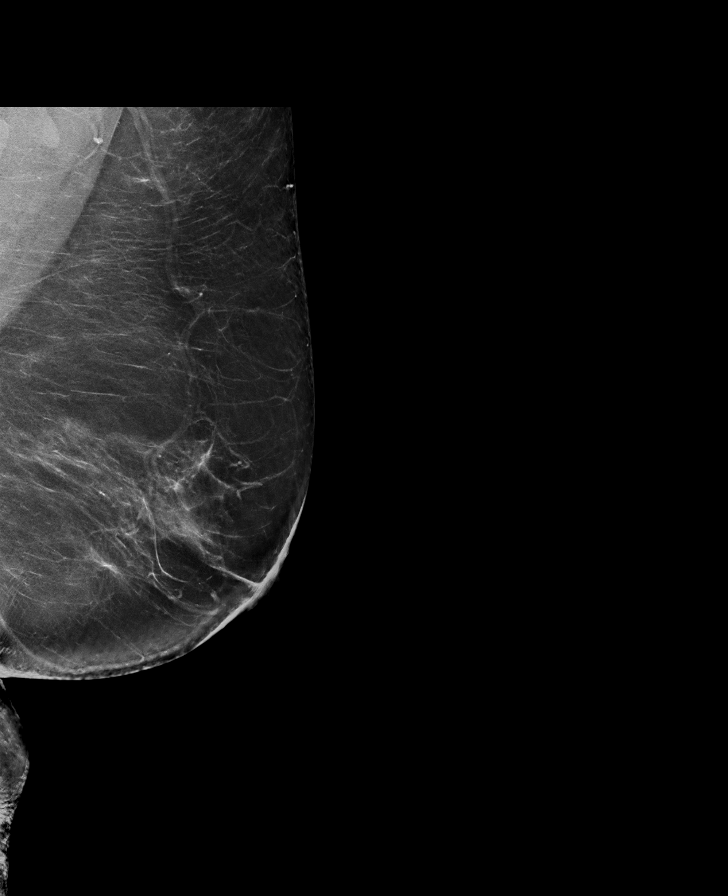

[L CC synth-2D]
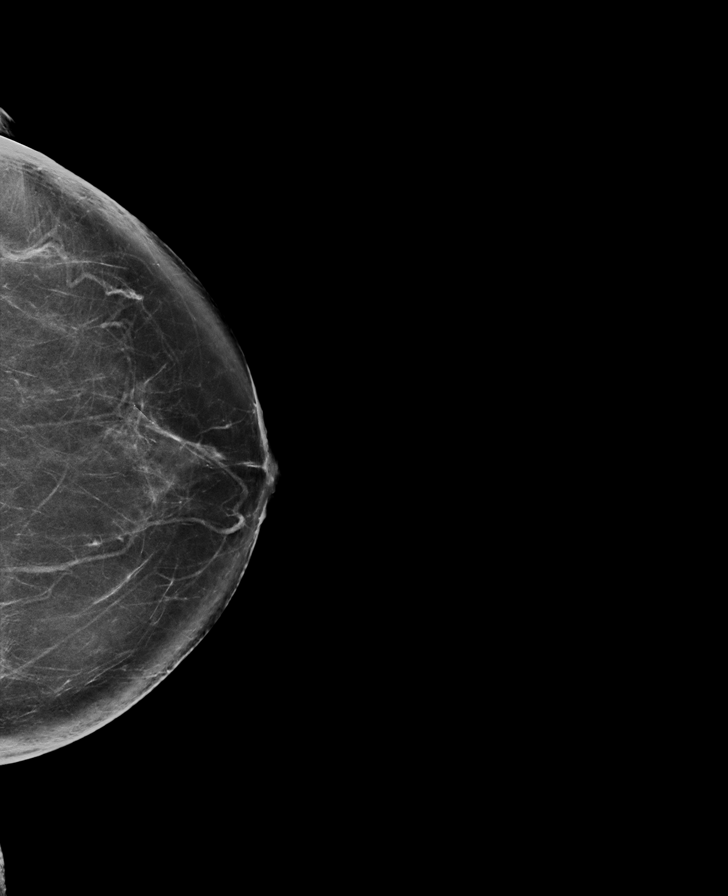

[R MLO synth-2D]
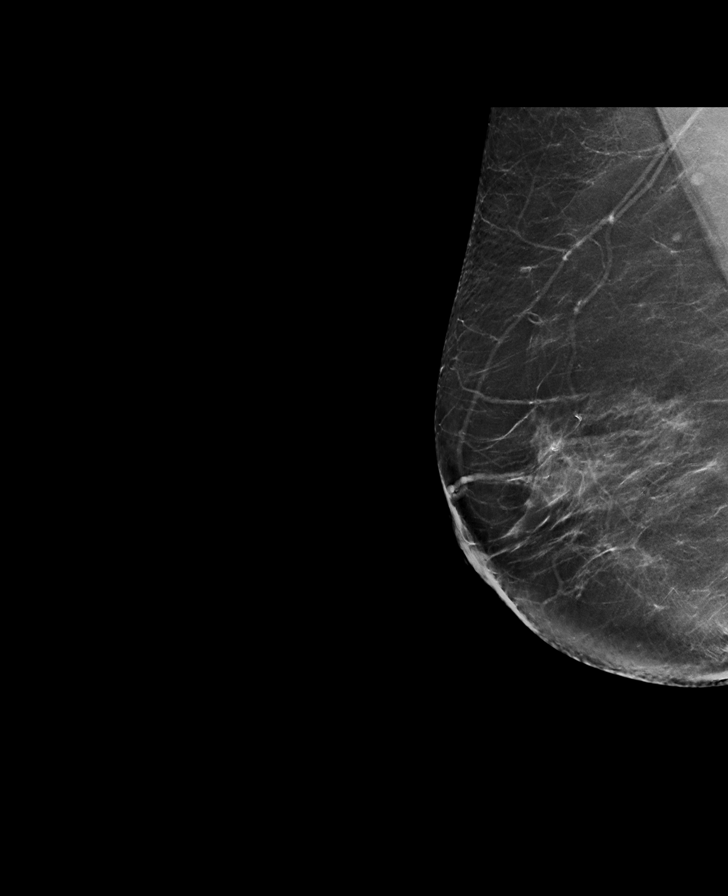

[R CC tomo · tomo slice 39/77.0]
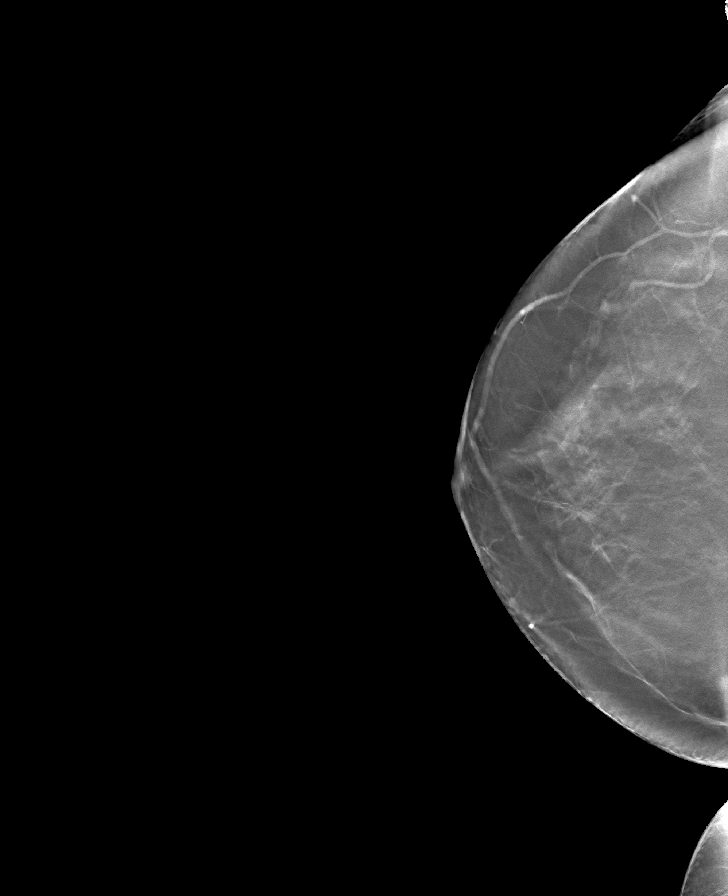

[L MLO tomo · tomo slice 42/83.0]
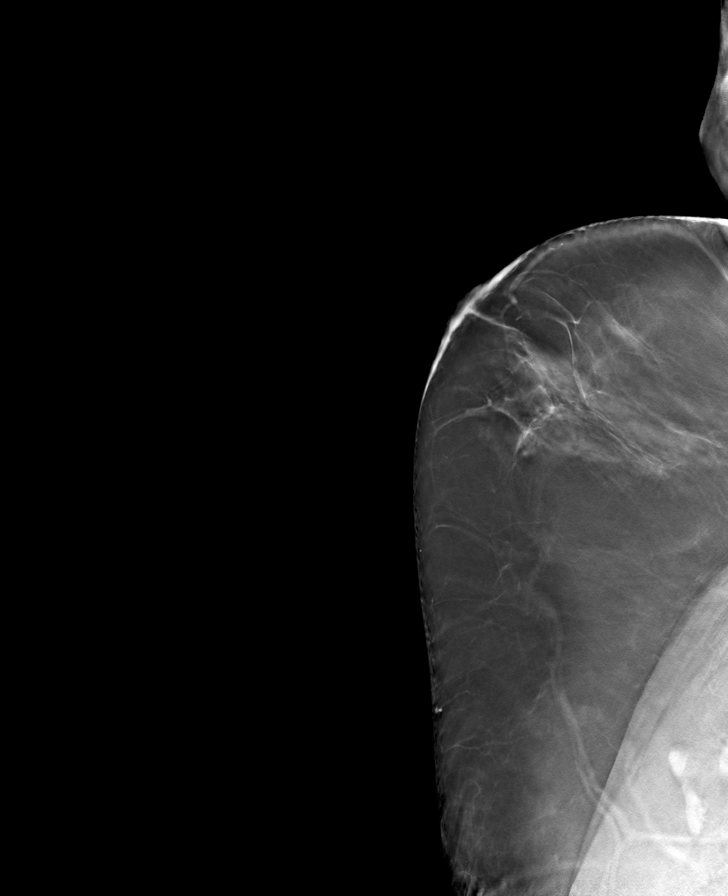

[L CC tomo · tomo slice 40/79.0]
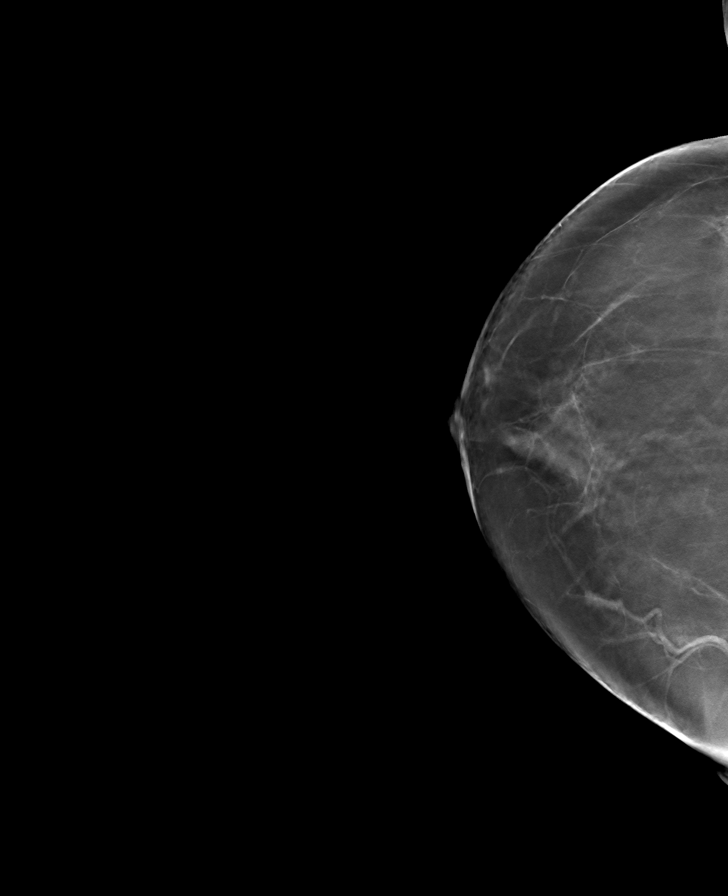

[R MLO tomo · tomo slice 39/77.0]
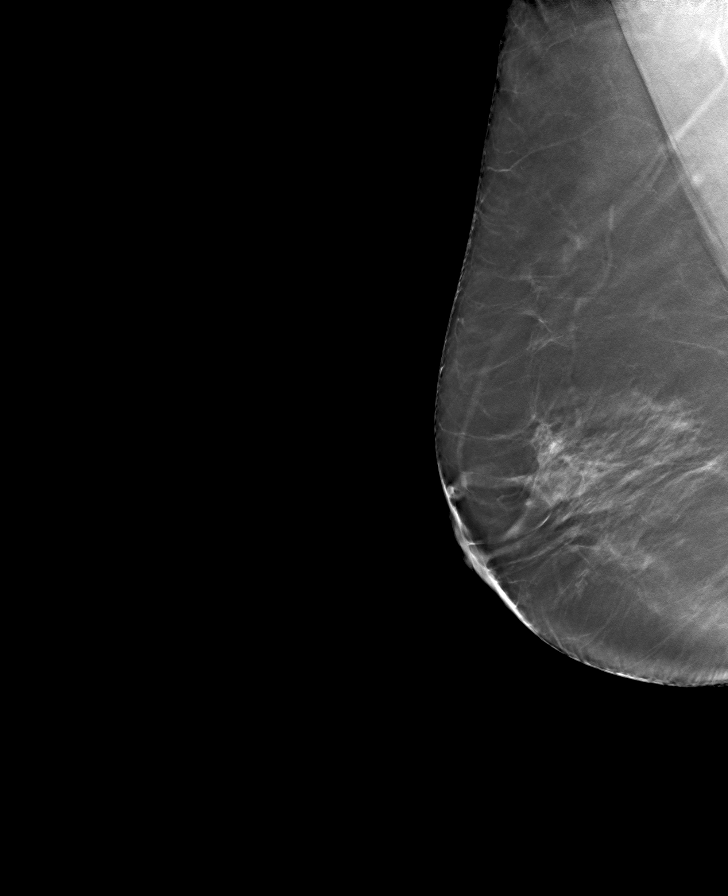

[8 of 24 positions shown; findings below may reference images not displayed]

ACR Breast Density Category b: There are scattered areas of
fibroglandular density.
FINDINGS: There are no findings suspicious for malignancy. Images were
processed with CAD.
IMPRESSION: No mammographic evidence of malignancy. A result letter of this
screening mammogram will be mailed directly to the patient.

RECOMMENDATION:
Screening mammogram in one year. (Code:[id number]-775)

BI-RADS CATEGORY  1: Negative.

## 2021-04-04 ENCOUNTER — Other Ambulatory Visit (HOSPITAL_BASED_OUTPATIENT_CLINIC_OR_DEPARTMENT_OTHER): Payer: Self-pay | Admitting: Family Medicine

## 2021-04-04 DIAGNOSIS — Z1231 Encounter for screening mammogram for malignant neoplasm of breast: Secondary | ICD-10-CM

## 2021-04-11 ENCOUNTER — Other Ambulatory Visit: Payer: Self-pay | Admitting: Family Medicine

## 2021-04-11 DIAGNOSIS — I10 Essential (primary) hypertension: Secondary | ICD-10-CM

## 2021-05-09 ENCOUNTER — Inpatient Hospital Stay (HOSPITAL_BASED_OUTPATIENT_CLINIC_OR_DEPARTMENT_OTHER): Admission: RE | Admit: 2021-05-09 | Payer: Medicare Other | Source: Ambulatory Visit

## 2021-06-07 ENCOUNTER — Encounter (HOSPITAL_BASED_OUTPATIENT_CLINIC_OR_DEPARTMENT_OTHER): Payer: Self-pay

## 2021-06-07 ENCOUNTER — Ambulatory Visit (HOSPITAL_BASED_OUTPATIENT_CLINIC_OR_DEPARTMENT_OTHER)
Admission: RE | Admit: 2021-06-07 | Discharge: 2021-06-07 | Disposition: A | Discharge status: Home / Self Care | Payer: Medicare Other | Source: Ambulatory Visit | Attending: Family Medicine | Admitting: Family Medicine

## 2021-06-07 ENCOUNTER — Other Ambulatory Visit: Payer: Self-pay

## 2021-06-07 DIAGNOSIS — Z1231 Encounter for screening mammogram for malignant neoplasm of breast: Secondary | ICD-10-CM | POA: Diagnosis not present

## 2021-06-16 ENCOUNTER — Ambulatory Visit (INDEPENDENT_AMBULATORY_CARE_PROVIDER_SITE_OTHER): Payer: Medicare Other | Admitting: Family Medicine

## 2021-06-16 ENCOUNTER — Encounter: Payer: Self-pay | Admitting: Family Medicine

## 2021-06-16 ENCOUNTER — Ambulatory Visit: Payer: Medicare Other | Attending: Internal Medicine

## 2021-06-16 VITALS — BP 130/60 | HR 95 | Temp 97.8°F | Resp 18 | Ht 64.0 in | Wt 205.8 lb

## 2021-06-16 DIAGNOSIS — E039 Hypothyroidism, unspecified: Secondary | ICD-10-CM

## 2021-06-16 DIAGNOSIS — I10 Essential (primary) hypertension: Secondary | ICD-10-CM

## 2021-06-16 DIAGNOSIS — M8580 Other specified disorders of bone density and structure, unspecified site: Secondary | ICD-10-CM

## 2021-06-16 DIAGNOSIS — Z23 Encounter for immunization: Secondary | ICD-10-CM

## 2021-06-16 DIAGNOSIS — H6123 Impacted cerumen, bilateral: Secondary | ICD-10-CM

## 2021-06-16 DIAGNOSIS — Z0000 Encounter for general adult medical examination without abnormal findings: Secondary | ICD-10-CM

## 2021-06-16 DIAGNOSIS — H6121 Impacted cerumen, right ear: Secondary | ICD-10-CM | POA: Insufficient documentation

## 2021-06-16 DIAGNOSIS — E785 Hyperlipidemia, unspecified: Secondary | ICD-10-CM

## 2021-06-16 DIAGNOSIS — E669 Obesity, unspecified: Secondary | ICD-10-CM

## 2021-06-16 DIAGNOSIS — Z0001 Encounter for general adult medical examination with abnormal findings: Secondary | ICD-10-CM

## 2021-06-16 LAB — CBC WITH DIFFERENTIAL/PLATELET
Basophils Absolute: 0 10*3/uL (ref 0.0–0.1)
Basophils Relative: 0.3 % (ref 0.0–3.0)
Eosinophils Absolute: 0.1 10*3/uL (ref 0.0–0.7)
Eosinophils Relative: 2 % (ref 0.0–5.0)
HCT: 40.1 % (ref 36.0–46.0)
Hemoglobin: 13.3 g/dL (ref 12.0–15.0)
Lymphocytes Relative: 36.2 % (ref 12.0–46.0)
Lymphs Abs: 2 10*3/uL (ref 0.7–4.0)
MCHC: 33.2 g/dL (ref 30.0–36.0)
MCV: 93 fl (ref 78.0–100.0)
Monocytes Absolute: 0.5 10*3/uL (ref 0.1–1.0)
Monocytes Relative: 9.7 % (ref 3.0–12.0)
Neutro Abs: 2.9 10*3/uL (ref 1.4–7.7)
Neutrophils Relative %: 51.8 % (ref 43.0–77.0)
Platelets: 210 10*3/uL (ref 150.0–400.0)
RBC: 4.31 Mil/uL (ref 3.87–5.11)
RDW: 13.5 % (ref 11.5–15.5)
WBC: 5.6 10*3/uL (ref 4.0–10.5)

## 2021-06-16 LAB — COMPREHENSIVE METABOLIC PANEL
ALT: 18 U/L (ref 0–35)
AST: 16 U/L (ref 0–37)
Albumin: 4.3 g/dL (ref 3.5–5.2)
Alkaline Phosphatase: 100 U/L (ref 39–117)
BUN: 17 mg/dL (ref 6–23)
CO2: 27 mEq/L (ref 19–32)
Calcium: 9.6 mg/dL (ref 8.4–10.5)
Chloride: 102 mEq/L (ref 96–112)
Creatinine, Ser: 0.89 mg/dL (ref 0.40–1.20)
GFR: 59.63 mL/min — ABNORMAL LOW (ref >60.00)
Glucose, Bld: 104 mg/dL — ABNORMAL HIGH (ref 70–99)
Potassium: 3.9 mEq/L (ref 3.5–5.1)
Sodium: 139 mEq/L (ref 135–145)
Total Bilirubin: 0.6 mg/dL (ref 0.2–1.2)
Total Protein: 6.9 g/dL (ref 6.0–8.3)

## 2021-06-16 LAB — LIPID PANEL
Cholesterol: 144 mg/dL (ref 0–200)
HDL: 57.8 mg/dL (ref >39.00)
LDL Cholesterol: 62 mg/dL (ref 0–99)
NonHDL: 86.39
Total CHOL/HDL Ratio: 2
Triglycerides: 122 mg/dL (ref 0.0–149.0)
VLDL: 24.4 mg/dL (ref 0.0–40.0)

## 2021-06-16 LAB — TSH: TSH: 2.23 u[IU]/mL (ref 0.35–5.50)

## 2021-06-16 NOTE — Assessment & Plan Note (Signed)
Use debrox  rto for irrigation if needed

## 2021-06-16 NOTE — Assessment & Plan Note (Signed)
Tolerating statin, encouraged heart healthy diet, avoid trans fats, minimize simple carbs and saturated fats. Increase exercise as tolerated 

## 2021-06-16 NOTE — Progress Notes (Addendum)
Subjective:   By signing my name below, I, Zite Okoli, attest that this documentation has been prepared under the direction and in the presence of Ann Held, DO. 06/16/2021   Patient ID: Carla Reilly, female    DOB: [birth year], 87 y.o.   MRN: [id number]  Chief Complaint  Patient presents with   Annual Exam    Pt states fasting     HPI Patient is in today for a comprehensive physical exam.  Her blood pressure was high at the beginning of the visit- 146/60   She exercises by walking.  She is UTD on vision care. She is looking for a new dentist.  She denies fever,  ear pain,congestion, sinus pain, sore throat, eye pain, chest pain, palpitations, cough, shortness of breath, wheezing, nausea. vomiting, diarrhea, constipation, blood in stool, dysuria,frequency, hematuria and headaches.   She has received the flu vaccine. She has 4 Covid-19 vaccines and will get the booster vaccine today.  No changes in family medical history.  Past Medical History:  Diagnosis Date   Carpal tunnel syndrome    Cataracts, bilateral    Hyperlipidemia    Thyroid disease    Hypothyroidism   Wears dentures    Wears glasses     Past Surgical History:  Procedure Laterality Date   CATARACTS REMOVED OCT & NOV 2017     OCT & NOV 2017   DILATION AND CURETTAGE OF UTERUS  1980   HYSTEROSCOPY WITH D & C N/A 03/16/2015   Procedure: DILATATION AND CURETTAGE /HYSTEROSCOPY;  Surgeon: Maisie Fus, MD;  Location: [facility];  Service: Gynecology;  Laterality: N/A;   TONSILLECTOMY     TOTAL ABDOMINAL HYSTERECTOMY W/ BILATERAL SALPINGOOPHORECTOMY Bilateral 07/2015   Dr Hurley Cisco    Family History  Problem Relation Age of Onset   Hypothyroidism Mother    Dementia Mother        died at 97yo   Prostate cancer Father    Cancer Father        prostate   Lung cancer Sister    Hypertension Brother    Colon cancer Son 66   Testicular cancer Grandchild     Social History    Socioeconomic History   Marital status: Widowed    Spouse name: Not on file   Number of children: 2   Years of education: 71   Highest education level: Not on file  Occupational History    Employer: [employer]    Comment: retired 12/2014  Tobacco Use   Smoking status: Former    Types: Cigarettes    Quit date: 06/04/1972    Years since quitting: 49.0   Smokeless tobacco: Never  Substance and Sexual Activity   Alcohol use: No   Drug use: No   Sexual activity: Yes    Partners: Male  Other Topics Concern   Not on file  Social History Narrative   Pt lives alone in a house    Social Determinants of Health   Financial Resource Strain: Not on file  Food Insecurity: Not on file  Transportation Needs: Not on file  Physical Activity: Not on file  Stress: Not on file  Social Connections: Not on file  Intimate Partner Violence: Not on file    Outpatient Medications Prior to Visit  Medication Sig Dispense Refill   Coenzyme Q10 (VITALINE COQ10 PO) Take 600 mg by mouth.     EPINEPHrine 0.3 mg/0.3 mL IJ SOAJ injection Inject 0.3 mLs (0.3 mg  total) into the muscle once. 3 Device 0   levothyroxine (SYNTHROID) 88 MCG tablet TAKE 1 TABLET BY MOUTH  DAILY BEFORE BREAKFAST 90 tablet 3   losartan-hydrochlorothiazide (HYZAAR) 50-12.5 MG tablet TAKE 1 TABLET BY MOUTH  DAILY 90 tablet 3   Lutein 40 MG CAPS Take 1 capsule by mouth daily.     simvastatin (ZOCOR) 40 MG tablet TAKE 1 TABLET BY MOUTH AT  BEDTIME 90 tablet 1   Specialty Vitamins Products (RA EAR CARE) TABS Take by mouth as needed.      No facility-administered medications prior to visit.    Allergies  Allergen Reactions   Yellow Jacket Venom [Bee Venom] Hives   Zestoretic [Lisinopril-Hydrochlorothiazide] Cough    Review of Systems  Constitutional:  Negative for fever.  HENT:  Negative for congestion, ear pain, hearing loss, sinus pain and sore throat.   Eyes:  Negative for blurred vision and pain.  Respiratory:  Negative  for cough, sputum production, shortness of breath and wheezing.   Cardiovascular:  Negative for chest pain and palpitations.  Gastrointestinal:  Negative for blood in stool, constipation, diarrhea, nausea and vomiting.  Genitourinary:  Negative for dysuria, frequency, hematuria and urgency.  Musculoskeletal:  Negative for back pain, falls and myalgias.  Neurological:  Negative for dizziness, sensory change, loss of consciousness, weakness and headaches.  Endo/Heme/Allergies:  Negative for environmental allergies. Does not bruise/bleed easily.  Psychiatric/Behavioral:  Negative for depression and suicidal ideas. The patient is not nervous/anxious and does not have insomnia.       Objective:    Physical Exam Constitutional:      General: She is not in acute distress.    Appearance: Normal appearance. She is not ill-appearing.  HENT:     Head: Normocephalic and atraumatic.     Right Ear: Tympanic membrane, ear canal and external ear normal.     Left Ear: Tympanic membrane, ear canal and external ear normal.     Ears:     Comments: Cerumen in both ears  Eyes:     Extraocular Movements: Extraocular movements intact.     Pupils: Pupils are equal, round, and reactive to light.  Cardiovascular:     Rate and Rhythm: Normal rate and regular rhythm.     Pulses: Normal pulses.     Heart sounds: Normal heart sounds. No murmur heard.   No gallop.  Pulmonary:     Effort: Pulmonary effort is normal. No respiratory distress.     Breath sounds: Normal breath sounds. No wheezing, rhonchi or rales.  Abdominal:     General: Bowel sounds are normal. There is no distension.     Palpations: Abdomen is soft. There is no mass.     Tenderness: There is no abdominal tenderness. There is no guarding or rebound.     Hernia: No hernia is present.  Musculoskeletal:     Cervical back: Normal range of motion and neck supple.  Lymphadenopathy:     Cervical: No cervical adenopathy.  Skin:    General: Skin is  warm and dry.  Neurological:     Mental Status: She is alert and oriented to person, place, and time.  Psychiatric:        Behavior: Behavior normal.    BP 130/60    Pulse 95    Temp 97.8 F (36.6 C) (Oral)    Resp 18    Ht 5\' 4"  (1.626 m)    Wt 205 lb 12.8 oz (93.4 kg)    SpO2  99%    BMI 35.33 kg/m  Wt Readings from Last 3 Encounters:  06/16/21 205 lb 12.8 oz (93.4 kg)  12/08/20 204 lb 3.2 oz (92.6 kg)  06/10/20 191 lb 3.2 oz (86.7 kg)    Diabetic Foot Exam - Simple   No data filed    Lab Results  Component Value Date   WBC 5.6 06/10/2020   HGB 14.4 06/10/2020   HCT 42.4 06/10/2020   PLT 216.0 06/10/2020   GLUCOSE 99 12/08/2020   CHOL 150 12/08/2020   TRIG 106.0 12/08/2020   HDL 62.70 12/08/2020   LDLCALC 66 12/08/2020   ALT 18 12/08/2020   AST 15 12/08/2020   NA 140 12/08/2020   K 3.7 12/08/2020   CL 103 12/08/2020   CREATININE 0.93 12/08/2020   BUN 19 12/08/2020   CO2 27 12/08/2020   TSH 2.59 12/08/2020    Lab Results  Component Value Date   TSH 2.59 12/08/2020   Lab Results  Component Value Date   WBC 5.6 06/10/2020   HGB 14.4 06/10/2020   HCT 42.4 06/10/2020   MCV 94.4 06/10/2020   PLT 216.0 06/10/2020   Lab Results  Component Value Date   NA 140 12/08/2020   K 3.7 12/08/2020   CO2 27 12/08/2020   GLUCOSE 99 12/08/2020   BUN 19 12/08/2020   CREATININE 0.93 12/08/2020   BILITOT 0.6 12/08/2020   ALKPHOS 87 12/08/2020   AST 15 12/08/2020   ALT 18 12/08/2020   PROT 6.7 12/08/2020   ALBUMIN 4.2 12/08/2020   CALCIUM 9.5 12/08/2020   GFR 56.77 (L) 12/08/2020   Lab Results  Component Value Date   CHOL 150 12/08/2020   Lab Results  Component Value Date   HDL 62.70 12/08/2020   Lab Results  Component Value Date   LDLCALC 66 12/08/2020   Lab Results  Component Value Date   TRIG 106.0 12/08/2020   Lab Results  Component Value Date   CHOLHDL 2 12/08/2020   No results found for: HGBA1C     Mammogram: Last checked on 06/07/2021.  Results were normal. Repeat in 1 year. Dexa: Last checked on 12/24/2014. Patient is considered osteopenic according to the [facility]. Repeat in 3-5 years. DUE  Assessment & Plan:   Problem List Items Addressed This Visit       Unprioritized   Bilateral impacted cerumen    Use debrox  rto for irrigation if needed      Essential hypertension    Well controlled, no changes to meds. Encouraged heart healthy diet such as the DASH diet and exercise as tolerated.  Repeat bp 130/60      Hyperlipidemia - Primary    Tolerating statin, encouraged heart healthy diet, avoid trans fats, minimize simple carbs and saturated fats. Increase exercise as tolerated      Relevant Orders   Lipid panel   Comprehensive metabolic panel   Hypothyroidism    Check labs Stable con't synthroid      Relevant Orders   TSH   Obesity (BMI 30-39.9)    con't exercise and diet      Osteopenia    Pt does not want to do anymore bmd because she does not want to take meds Encourage weight bearing exercise  con't calcium and vita d      Preventative health care    ghm utd Check labs She will get covid booster today ACP info given to pt to update her info if needed  Other Visit Diagnoses     Primary hypertension       Relevant Orders   Lipid panel   Comprehensive metabolic panel   CBC with Differential/Platelet        No orders of the defined types were placed in this encounter.   I,Zite Okoli,acting as a Education administrator for [employer], DO.,have documented all relevant documentation on the behalf of Ann Held, DO,as directed by  Ann Held, DO while in the presence of Ann Held, [place], DO., personally preformed the services described in this documentation.  All medical record entries made by the scribe were at my direction and in my presence.  I have reviewed the chart and discharge instructions (if applicable) and  agree that the record reflects my personal performance and is accurate and complete. 06/16/2021

## 2021-06-16 NOTE — Patient Instructions (Addendum)
Debrox-- for ear wax    Preventive Dental Care, Adult Preventive dental care includes seeing a dentist regularly and practicing good dental care (oral hygiene) at home. These actions can help to prevent cavities, root canal problems, gum disease (gingivitis), tooth loss, and other tooth problems. Regular dental exams may also help your health care provider diagnose other medical problems. Many diseases, including mouth cancers, have early signs that can be found during a preventive dental care visit. What can I expect for my preventive dental care visit? Counseling At your visit, your dentist may ask you about: Your overall health and diet. Any new symptoms, such as: Bleeding gums. Mouth, tooth, or jaw pain. Dull headache. Using a mouthguard for sports or because of teeth grinding. The need or desire to get braces to straighten teeth (orthodontic care). Physical exam Your dentist will do a mouth (oral) exam to check for: Cavities. Gum disease or other problems. Signs of cancer. Neck swelling or lumps. Abnormal jaw movement or pain in the jaw joint. Signs of teeth grinding, such as loose or fractured teeth. Other services You may also have: Dental X-rays. Your teeth cleaned. Follow these instructions at home: Oral health   Brush with fluoride toothpaste every morning and night. If possible, brush within 10 minutes after every meal. Ask your dentist for toothpaste recommendations. Floss at least one time every day. Check your teeth for white or brown spots after brushing. These may be signs of cavities. Check your gums for swelling or bleeding. These may be signs of gum disease. Take over-the-counter and prescription medicines only as told by your dentist. If you have a permanent tooth knocked out: Find the tooth. Pick it up by the top (crown) with a tissue or gauze. Wash the tooth for no more than 10 seconds under cold, running water. Try to put the tooth back into the gum  socket. Put the tooth in a glass of milk if you cannot get it back in place. Go to your dentist or an emergency department right away. Take the tooth with you. Eating and drinking Eat a diet that includes plenty of fruits, vegetables, milk and dairy products, whole grains, and proteins. Do not eat a lot of starchy foods or foods with added sugar. Talk with your health care provider if you have questions about following a healthy diet. Avoid sodas, sugary snacks, and sticky candies. Choose water or milk instead of fruit juice, sodas, or sports drinks. General instructions Do not use any products that contain nicotine or tobacco, such as cigarettes, e-cigarettes, and chewing tobacco. If you need help quitting, ask your health care provider. Do not get mouth piercings. Always wear a mouthguard when playing contact or collision sports. For more information: American Dental Association: www.mouthhealthy.org Contact a dentist if you have: Gum, tooth, or jaw pain. Red, swollen, or bleeding gums. A tooth or teeth that are very sensitive to hot or cold. Very bad breath. A problem with a filling, crown, implant, or denture. A broken or loose tooth. A growth or sore in your mouth that is not going away. A dry mouth. What's next? See your dentist one or two times each year for oral exams and cleanings. If you have an early problem, like a cavity, your dentist will schedule time for you to get treatment. If you have a tooth root problem, gum disease, or a sign of another disease, your dentist may send you to see another health care provider for care. This information is not intended  to replace advice given to you by your health care provider. Make sure you discuss any questions you have with your health care provider. Document Revised: 03/14/2018 Document Reviewed: 01/06/2018 Elsevier Patient Education  [facility].

## 2021-06-16 NOTE — Progress Notes (Signed)
° °  Covid-19 Vaccination Clinic  Name:  Carloyn Lahue    MRN: [id number] DOB: 1934/07/22  06/16/2021  Ms. Caroll was observed post Covid-19 immunization for 15 minutes without incident. She was provided with Vaccine Information Sheet and instruction to access the V-Safe system.   Ms. Risenhoover was instructed to call 911 with any severe reactions post vaccine: Difficulty breathing  Swelling of face and throat  A fast heartbeat  A bad rash all over body  Dizziness and weakness   Immunizations Administered     Name Date Dose VIS Date Route   Pfizer Covid-19 Vaccine Bivalent Booster 06/16/2021  9:08 AM 0.3 mL 02/01/2021 Intramuscular   Manufacturer: [place]   Lot: [id number]   NDC: [place] Covid-19 Vaccine Bivalent Booster 06/16/2021 10:23 AM 0.3 mL 02/01/2021 Intramuscular   Manufacturer: [place]   Lot: [id number]   [place]: [phone]

## 2021-06-16 NOTE — Assessment & Plan Note (Signed)
Pt does not want to do anymore bmd because she does not want to take meds Encourage weight bearing exercise  con't calcium and vita d

## 2021-06-16 NOTE — Assessment & Plan Note (Signed)
Check labs Stable con't synthroid

## 2021-06-16 NOTE — Assessment & Plan Note (Addendum)
ghm utd Check labs She will get covid booster today ACP info given to pt to update her info if needed

## 2021-06-16 NOTE — Assessment & Plan Note (Signed)
con't exercise and diet

## 2021-06-16 NOTE — Assessment & Plan Note (Signed)
Well controlled, no changes to meds. Encouraged heart healthy diet such as the DASH diet and exercise as tolerated.  Repeat bp 130/60

## 2021-06-19 ENCOUNTER — Other Ambulatory Visit (HOSPITAL_BASED_OUTPATIENT_CLINIC_OR_DEPARTMENT_OTHER): Payer: Self-pay

## 2021-06-19 MED ORDER — PFIZER COVID-19 VAC BIVALENT 30 MCG/0.3ML IM SUSP
INTRAMUSCULAR | 0 refills | Status: DC
  Filled 2021-06-19: qty 0.3, 1d supply, fill #0

## 2021-06-19 NOTE — Progress Notes (Signed)
Lab Letter SENT  

## 2021-07-05 ENCOUNTER — Other Ambulatory Visit: Payer: Self-pay | Admitting: Family Medicine

## 2021-07-05 DIAGNOSIS — E785 Hyperlipidemia, unspecified: Secondary | ICD-10-CM

## 2021-07-20 ENCOUNTER — Encounter: Payer: Self-pay | Admitting: Family Medicine

## 2021-07-20 ENCOUNTER — Ambulatory Visit (INDEPENDENT_AMBULATORY_CARE_PROVIDER_SITE_OTHER): Payer: Medicare Other | Admitting: Family Medicine

## 2021-07-20 VITALS — BP 150/72 | HR 73 | Temp 97.6°F | Resp 16 | Ht 64.0 in | Wt 207.0 lb

## 2021-07-20 DIAGNOSIS — H6123 Impacted cerumen, bilateral: Secondary | ICD-10-CM | POA: Diagnosis not present

## 2021-07-20 NOTE — Progress Notes (Addendum)
Subjective:   By signing my name below, I, Shehryar Baig, attest that this documentation has been prepared under the direction and in the presence of Ann Held, DO  07/20/2021    Patient ID: Carla Reilly, female    DOB: 14-Apr-1935, 87 y.o.   MRN: [id number]  Chief Complaint  Patient presents with   Follow-up    Here for flushing  right ear follow up     HPI Patient is in today for a office visit.  She complains of right ear full of wax. She has been applying drops in her right ear for the past 3 days. She is interested in irrigating her right ear.    Past Medical History:  Diagnosis Date   Carpal tunnel syndrome    Cataracts, bilateral    Hyperlipidemia    Thyroid disease    Hypothyroidism   Wears dentures    Wears glasses     Past Surgical History:  Procedure Laterality Date   CATARACTS REMOVED OCT & NOV 2017     OCT & NOV 2017   DILATION AND CURETTAGE OF UTERUS  1980   HYSTEROSCOPY WITH D & C N/A 03/16/2015   Procedure: DILATATION AND CURETTAGE /HYSTEROSCOPY;  Surgeon: Maisie Fus, MD;  Location: [facility];  Service: Gynecology;  Laterality: N/A;   TONSILLECTOMY     TOTAL ABDOMINAL HYSTERECTOMY W/ BILATERAL SALPINGOOPHORECTOMY Bilateral 07/2015   Dr Hurley Cisco    Family History  Problem Relation Age of Onset   Hypothyroidism Mother    Dementia Mother        died at 94yo   Prostate cancer Father    Cancer Father        prostate   Lung cancer Sister    Hypertension Brother    Colon cancer Son 59   Testicular cancer Grandchild     Social History   Socioeconomic History   Marital status: Widowed    Spouse name: Not on file   Number of children: 2   Years of education: 74   Highest education level: Not on file  Occupational History    Employer: [employer]    Comment: retired 12/2014  Tobacco Use   Smoking status: Former    Types: Cigarettes    Quit date: 06/04/1972    Years since quitting: 49.1   Smokeless tobacco: Never   Substance and Sexual Activity   Alcohol use: No   Drug use: No   Sexual activity: Yes    Partners: Male  Other Topics Concern   Not on file  Social History Narrative   Pt lives alone in a house    Social Determinants of Health   Financial Resource Strain: Not on file  Food Insecurity: Not on file  Transportation Needs: Not on file  Physical Activity: Not on file  Stress: Not on file  Social Connections: Not on file  Intimate Partner Violence: Not on file    Outpatient Medications Prior to Visit  Medication Sig Dispense Refill   Coenzyme Q10 (VITALINE COQ10 PO) Take 600 mg by mouth.     COVID-19 mRNA bivalent vaccine, Pfizer, (PFIZER COVID-19 VAC BIVALENT) injection Inject into the muscle. 0.3 mL 0   EPINEPHrine 0.3 mg/0.3 mL IJ SOAJ injection Inject 0.3 mLs (0.3 mg total) into the muscle once. 3 Device 0   levothyroxine (SYNTHROID) 88 MCG tablet TAKE 1 TABLET BY MOUTH  DAILY BEFORE BREAKFAST 90 tablet 3   losartan-hydrochlorothiazide (HYZAAR) 50-12.5 MG tablet TAKE  1 TABLET BY MOUTH  DAILY 90 tablet 3   Lutein 40 MG CAPS Take 1 capsule by mouth daily.     simvastatin (ZOCOR) 40 MG tablet TAKE 1 TABLET BY MOUTH AT  BEDTIME 90 tablet 3   Specialty Vitamins Products (RA EAR CARE) TABS Take by mouth as needed.      No facility-administered medications prior to visit.    Allergies  Allergen Reactions   Yellow Jacket Venom [Bee Venom] Hives   Zestoretic [Lisinopril-Hydrochlorothiazide] Cough    Review of Systems  Constitutional:  Negative for fever and malaise/fatigue.  HENT:  Positive for hearing loss. Negative for congestion and ear pain.        (+)impacted cerumen in right ear   Eyes:  Negative for blurred vision.  Respiratory:  Negative for cough and shortness of breath.   Cardiovascular:  Negative for chest pain, palpitations and leg swelling.  Gastrointestinal:  Negative for vomiting.  Musculoskeletal:  Negative for back pain.  Skin:  Negative for rash.   Neurological:  Negative for loss of consciousness and headaches.      Objective:    Physical Exam Vitals and nursing note reviewed.  Constitutional:      General: She is not in acute distress.    Appearance: Normal appearance. She is not ill-appearing.  HENT:     Head: Normocephalic and atraumatic.     Comments: Both ears were irrigated. Left ear had mild amount of cerumen present after irrigation    Right Ear: Tympanic membrane, ear canal and external ear normal. There is impacted cerumen.     Left Ear: Tympanic membrane, ear canal and external ear normal. There is impacted cerumen.     Ears:     Comments: B/L ear impacted --- unable to remove with hoop Irrigated with no complications R ear.,  L ear wax too hard  She will use debrox and come back if needed for irrigation  TmI on Right  Eyes:     Extraocular Movements: Extraocular movements intact.     Pupils: Pupils are equal, round, and reactive to light.  Cardiovascular:     Rate and Rhythm: Normal rate and regular rhythm.     Heart sounds: Normal heart sounds. No murmur heard.   No gallop.  Pulmonary:     Effort: Pulmonary effort is normal. No respiratory distress.     Breath sounds: Normal breath sounds. No wheezing or rales.  Skin:    General: Skin is warm and dry.  Neurological:     Mental Status: She is alert and oriented to person, place, and time.  Psychiatric:        Behavior: Behavior normal.        Judgment: Judgment normal.    BP (!) 150/72 (BP Location: Right Arm, Patient Position: Sitting, Cuff Size: Normal)    Pulse 73    Temp 97.6 F (36.4 C) (Oral)    Resp 16    Ht 5\' 4"  (1.626 m)    Wt 207 lb (93.9 kg)    SpO2 96%    BMI 35.53 kg/m  Wt Readings from Last 3 Encounters:  07/20/21 207 lb (93.9 kg)  06/16/21 205 lb 12.8 oz (93.4 kg)  12/08/20 204 lb 3.2 oz (92.6 kg)    Diabetic Foot Exam - Simple   No data filed    Lab Results  Component Value Date   WBC 5.6 06/16/2021   HGB 13.3 06/16/2021    HCT 40.1 06/16/2021   PLT  210.0 06/16/2021   GLUCOSE 104 (H) 06/16/2021   CHOL 144 06/16/2021   TRIG 122.0 06/16/2021   HDL 57.80 06/16/2021   LDLCALC 62 06/16/2021   ALT 18 06/16/2021   AST 16 06/16/2021   NA 139 06/16/2021   K 3.9 06/16/2021   CL 102 06/16/2021   CREATININE 0.89 06/16/2021   BUN 17 06/16/2021   CO2 27 06/16/2021   TSH 2.23 06/16/2021    Lab Results  Component Value Date   TSH 2.23 06/16/2021   Lab Results  Component Value Date   WBC 5.6 06/16/2021   HGB 13.3 06/16/2021   HCT 40.1 06/16/2021   MCV 93.0 06/16/2021   PLT 210.0 06/16/2021   Lab Results  Component Value Date   NA 139 06/16/2021   K 3.9 06/16/2021   CO2 27 06/16/2021   GLUCOSE 104 (H) 06/16/2021   BUN 17 06/16/2021   CREATININE 0.89 06/16/2021   BILITOT 0.6 06/16/2021   ALKPHOS 100 06/16/2021   AST 16 06/16/2021   ALT 18 06/16/2021   PROT 6.9 06/16/2021   ALBUMIN 4.3 06/16/2021   CALCIUM 9.6 06/16/2021   GFR 59.63 (L) 06/16/2021   Lab Results  Component Value Date   CHOL 144 06/16/2021   Lab Results  Component Value Date   HDL 57.80 06/16/2021   Lab Results  Component Value Date   LDLCALC 62 06/16/2021   Lab Results  Component Value Date   TRIG 122.0 06/16/2021   Lab Results  Component Value Date   CHOLHDL 2 06/16/2021   No results found for: HGBA1C     Assessment & Plan:   Problem List Items Addressed This Visit       Unprioritized   Bilateral impacted cerumen - Primary    r ear irrigated successfully She will use debrox in the left ear and return if needed for irrigation         No orders of the defined types were placed in this encounter.   I, Ann Held, DO, personally preformed the services described in this documentation.  All medical record entries made by the scribe were at my direction and in my presence.  I have reviewed the chart and discharge instructions (if applicable) and agree that the record reflects my personal performance  and is accurate and complete. 07/20/2021   I,Shehryar Baig,acting as a scribe for Ann Held, DO.,have documented all relevant documentation on the behalf of Ann Held, DO,as directed by  Ann Held, DO while in the presence of Ann Held, DO.   Ann Held, DO

## 2021-07-20 NOTE — Assessment & Plan Note (Signed)
r ear irrigated successfully She will use debrox in the left ear and return if needed for irrigation

## 2021-07-20 NOTE — Patient Instructions (Signed)
Earwax Buildup, Adult ?The ears produce a substance called earwax that helps keep bacteria out of the ear and protects the skin in the ear canal. Occasionally, earwax can build up in the ear and cause discomfort or hearing loss. ?What are the causes? ?This condition is caused by a buildup of earwax. Ear canals are self-cleaning. Ear wax is made in the outer part of the ear canal and generally falls out in small amounts over time. ?When the self-cleaning mechanism is not working, earwax builds up and can cause decreased hearing and discomfort. Attempting to clean ears with cotton swabs can push the earwax deep into the ear canal and cause decreased hearing and pain. ?What increases the risk? ?This condition is more likely to develop in people who: ?Clean their ears often with cotton swabs. ?Pick at their ears. ?Use earplugs or in-ear headphones often, or wear hearing aids. ?The following factors may also make you more likely to develop this condition: ?Being female. ?Being of older age. ?Naturally producing more earwax. ?Having narrow ear canals. ?Having earwax that is overly thick or sticky. ?Having excess hair in the ear canal. ?Having eczema. ?Being dehydrated. ?What are the signs or symptoms? ?Symptoms of this condition include: ?Reduced or muffled hearing. ?A feeling of fullness in the ear or feeling that the ear is plugged. ?Fluid coming from the ear. ?Ear pain or an itchy ear. ?Ringing in the ear. ?Coughing. ?Balance problems. ?An obvious piece of earwax that can be seen inside the ear canal. ?How is this diagnosed? ?This condition may be diagnosed based on: ?Your symptoms. ?Your medical history. ?An ear exam. During the exam, your health care provider will look into your ear with an instrument called an otoscope. ?You may have tests, including a hearing test. ?How is this treated? ?This condition may be treated by: ?Using ear drops to soften the earwax. ?Having the earwax removed by a health care provider. The  health care provider may: ?Flush the ear with water. ?Use an instrument that has a loop on the end (curette). ?Use a suction device. ?Having surgery to remove the wax buildup. This may be done in severe cases. ?Follow these instructions at home: ? ?Take over-the-counter and prescription medicines only as told by your health care provider. ?Do not put any objects, including cotton swabs, into your ear. You can clean the opening of your ear canal with a washcloth or facial tissue. ?Follow instructions from your health care provider about cleaning your ears. Do not overclean your ears. ?Drink enough fluid to keep your urine pale yellow. This will help to thin the earwax. ?Keep all follow-up visits as told. If earwax builds up in your ears often or if you use hearing aids, consider seeing your health care provider for routine, preventive ear cleanings. Ask your health care provider how often you should schedule your cleanings. ?If you have hearing aids, clean them according to instructions from the manufacturer and your health care provider. ?Contact a health care provider if: ?You have ear pain. ?You develop a fever. ?You have pus or other fluid coming from your ear. ?You have hearing loss. ?You have ringing in your ears that does not go away. ?You feel like the room is spinning (vertigo). ?Your symptoms do not improve with treatment. ?Get help right away if: ?You have bleeding from the affected ear. ?You have severe ear pain. ?Summary ?Earwax can build up in the ear and cause discomfort or hearing loss. ?The most common symptoms of this condition include   reduced or muffled hearing, a feeling of fullness in the ear, or feeling that the ear is plugged. ?This condition may be diagnosed based on your symptoms, your medical history, and an ear exam. ?This condition may be treated by using ear drops to soften the earwax or by having the earwax removed by a health care provider. ?Do not put any objects, including cotton  swabs, into your ear. You can clean the opening of your ear canal with a washcloth or facial tissue. ?This information is not intended to replace advice given to you by your health care provider. Make sure you discuss any questions you have with your health care provider. ?Document Revised: 09/08/2019 Document Reviewed: 09/08/2019 ?Elsevier Patient Education ? 2022 Elsevier Inc[employer] ? ?

## 2021-12-07 ENCOUNTER — Encounter: Payer: Self-pay | Admitting: Family Medicine

## 2021-12-07 ENCOUNTER — Ambulatory Visit (INDEPENDENT_AMBULATORY_CARE_PROVIDER_SITE_OTHER): Payer: Medicare Other | Admitting: Family Medicine

## 2021-12-07 VITALS — BP 110/60 | HR 80 | Temp 98.0°F | Resp 18 | Ht 64.0 in | Wt 206.2 lb

## 2021-12-07 DIAGNOSIS — H6122 Impacted cerumen, left ear: Secondary | ICD-10-CM | POA: Diagnosis not present

## 2021-12-07 DIAGNOSIS — E785 Hyperlipidemia, unspecified: Secondary | ICD-10-CM | POA: Diagnosis not present

## 2021-12-07 DIAGNOSIS — E039 Hypothyroidism, unspecified: Secondary | ICD-10-CM | POA: Diagnosis not present

## 2021-12-07 DIAGNOSIS — H6123 Impacted cerumen, bilateral: Secondary | ICD-10-CM

## 2021-12-07 LAB — CBC WITH DIFFERENTIAL/PLATELET
Basophils Absolute: 0 10*3/uL (ref 0.0–0.1)
Basophils Relative: 0.6 % (ref 0.0–3.0)
Eosinophils Absolute: 0.1 10*3/uL (ref 0.0–0.7)
Eosinophils Relative: 2.4 % (ref 0.0–5.0)
HCT: 40.5 % (ref 36.0–46.0)
Hemoglobin: 13.6 g/dL (ref 12.0–15.0)
Lymphocytes Relative: 35.3 % (ref 12.0–46.0)
Lymphs Abs: 2 10*3/uL (ref 0.7–4.0)
MCHC: 33.7 g/dL (ref 30.0–36.0)
MCV: 94.2 fl (ref 78.0–100.0)
Monocytes Absolute: 0.5 10*3/uL (ref 0.1–1.0)
Monocytes Relative: 9.3 % (ref 3.0–12.0)
Neutro Abs: 3 10*3/uL (ref 1.4–7.7)
Neutrophils Relative %: 52.4 % (ref 43.0–77.0)
Platelets: 216 10*3/uL (ref 150.0–400.0)
RBC: 4.3 Mil/uL (ref 3.87–5.11)
RDW: 13.1 % (ref 11.5–15.5)
WBC: 5.6 10*3/uL (ref 4.0–10.5)

## 2021-12-07 LAB — LIPID PANEL
Cholesterol: 147 mg/dL (ref 0–200)
HDL: 61.7 mg/dL (ref >39.00)
LDL Cholesterol: 62 mg/dL (ref 0–99)
NonHDL: 85.34
Total CHOL/HDL Ratio: 2
Triglycerides: 118 mg/dL (ref 0.0–149.0)
VLDL: 23.6 mg/dL (ref 0.0–40.0)

## 2021-12-07 LAB — COMPREHENSIVE METABOLIC PANEL
ALT: 16 U/L (ref 0–35)
AST: 17 U/L (ref 0–37)
Albumin: 4.4 g/dL (ref 3.5–5.2)
Alkaline Phosphatase: 96 U/L (ref 39–117)
BUN: 21 mg/dL (ref 6–23)
CO2: 28 mEq/L (ref 19–32)
Calcium: 9.9 mg/dL (ref 8.4–10.5)
Chloride: 104 mEq/L (ref 96–112)
Creatinine, Ser: 0.98 mg/dL (ref 0.40–1.20)
GFR: 52.94 mL/min — ABNORMAL LOW (ref >60.00)
Glucose, Bld: 107 mg/dL — ABNORMAL HIGH (ref 70–99)
Potassium: 4.3 mEq/L (ref 3.5–5.1)
Sodium: 140 mEq/L (ref 135–145)
Total Bilirubin: 0.6 mg/dL (ref 0.2–1.2)
Total Protein: 6.7 g/dL (ref 6.0–8.3)

## 2021-12-07 LAB — TSH: TSH: 1.65 u[IU]/mL (ref 0.35–5.50)

## 2021-12-07 MED ORDER — LEVOTHYROXINE SODIUM 88 MCG PO TABS: 88.0000 ug | ORAL_TABLET | Freq: Every day | ORAL | 3 refills | Status: DC

## 2021-12-07 NOTE — Patient Instructions (Signed)

## 2021-12-07 NOTE — Assessment & Plan Note (Signed)
Well controlled, no changes to meds. Encouraged heart healthy diet such as the DASH diet and exercise as tolerated.  °

## 2021-12-07 NOTE — Assessment & Plan Note (Signed)
Check labs con't synthroid 

## 2021-12-07 NOTE — Progress Notes (Addendum)
Subjective:   By signing my name below, I, Kellie Simmering, attest that this documentation has been prepared under the direction and in the presence of Carollee Herter, Sheldon, DO.     Patient ID: Carla Reilly, female    DOB: [birth year], 87 y.o.   MRN: [id number]  Chief Complaint  Patient presents with   Hypertension   Hyperlipidemia   Hypothyroidism   Follow-up    HPI Patient is in today for an office visit. To f/u bp , chol and thyroid.    She complains of left ear fullness.  Her blood pressure is doing well during this visit. She continues taking 50-12.5 mg Hyzaar daily PO and reports no new issues while taking it.  BP Readings from Last 3 Encounters:  12/07/21 110/60  07/20/21 (!) 150/72  06/16/21 130/60    Pulse Readings from Last 3 Encounters:  12/07/21 80  07/20/21 73  06/16/21 95    She is requesting a refill on 88 mcg synthroid daily PO.  She has 4 Covid-19 vaccines at this time.    Past Medical History:  Diagnosis Date   Carpal tunnel syndrome    Cataracts, bilateral    Hyperlipidemia    Thyroid disease    Hypothyroidism   Wears dentures    Wears glasses     Past Surgical History:  Procedure Laterality Date   CATARACTS REMOVED OCT & NOV 2017     OCT & NOV 2017   DILATION AND CURETTAGE OF UTERUS  1980   HYSTEROSCOPY WITH D & C N/A 03/16/2015   Procedure: DILATATION AND CURETTAGE /HYSTEROSCOPY;  Surgeon: Maisie Fus, MD;  Location: [facility];  Service: Gynecology;  Laterality: N/A;   TONSILLECTOMY     TOTAL ABDOMINAL HYSTERECTOMY W/ BILATERAL SALPINGOOPHORECTOMY Bilateral 07/2015   Dr Hurley Cisco    Family History  Problem Relation Age of Onset   Hypothyroidism Mother    Dementia Mother        died at 82yo   Prostate cancer Father    Cancer Father        prostate   Lung cancer Sister    Hypertension Brother    Colon cancer Son 42   Testicular cancer Grandchild     Social History   Socioeconomic History   Marital status:  Widowed    Spouse name: Not on file   Number of children: 2   Years of education: 55   Highest education level: Not on file  Occupational History    Employer: [employer]    Comment: retired 12/2014  Tobacco Use   Smoking status: Former    Types: Cigarettes    Quit date: 06/04/1972    Years since quitting: 49.6   Smokeless tobacco: Never  Substance and Sexual Activity   Alcohol use: No   Drug use: No   Sexual activity: Yes    Partners: Male  Other Topics Concern   Not on file  Social History Narrative   Pt lives alone in a house    Social Determinants of Health   Financial Resource Strain: Not on file  Food Insecurity: Not on file  Transportation Needs: Not on file  Physical Activity: Not on file  Stress: Not on file  Social Connections: Not on file  Intimate Partner Violence: Not on file    Outpatient Medications Prior to Visit  Medication Sig Dispense Refill   Coenzyme Q10 (VITALINE COQ10 PO) Take 600 mg by mouth.     EPINEPHrine  0.3 mg/0.3 mL IJ SOAJ injection Inject 0.3 mLs (0.3 mg total) into the muscle once. 3 Device 0   Lutein 40 MG CAPS Take 1 capsule by mouth daily.     simvastatin (ZOCOR) 40 MG tablet TAKE 1 TABLET BY MOUTH AT  BEDTIME 90 tablet 3   Specialty Vitamins Products (RA EAR CARE) TABS Take by mouth as needed.      levothyroxine (SYNTHROID) 88 MCG tablet TAKE 1 TABLET BY MOUTH  DAILY BEFORE BREAKFAST 90 tablet 3   losartan-hydrochlorothiazide (HYZAAR) 50-12.5 MG tablet TAKE 1 TABLET BY MOUTH  DAILY 90 tablet 3   COVID-19 mRNA bivalent vaccine, Pfizer, (PFIZER COVID-19 VAC BIVALENT) injection Inject into the muscle. (Patient not taking: Reported on 12/07/2021) 0.3 mL 0   No facility-administered medications prior to visit.    Allergies  Allergen Reactions   Yellow Jacket Venom [Bee Venom] Hives   Zestoretic [Lisinopril-Hydrochlorothiazide] Cough    Review of Systems  Constitutional:  Negative for fever and malaise/fatigue.  HENT:  Negative for  congestion.        (+)left ear fullness  Eyes:  Negative for blurred vision.  Respiratory:  Negative for shortness of breath.   Cardiovascular:  Negative for chest pain, palpitations and leg swelling.  Gastrointestinal:  Negative for abdominal pain, blood in stool and nausea.  Genitourinary:  Negative for dysuria and frequency.  Musculoskeletal:  Negative for falls.  Skin:  Negative for rash.  Neurological:  Negative for dizziness, loss of consciousness and headaches.  Endo/Heme/Allergies:  Negative for environmental allergies.  Psychiatric/Behavioral:  Negative for depression. The patient is not nervous/anxious.        Objective:    Physical Exam Vitals and nursing note reviewed.  Constitutional:      Appearance: Normal appearance. She is not ill-appearing.  HENT:     Head: Normocephalic and atraumatic.     Right Ear: Tympanic membrane, ear canal and external ear normal.     Left Ear: Ear canal and external ear normal. There is impacted cerumen.  Eyes:     Extraocular Movements: Extraocular movements intact.     Pupils: Pupils are equal, round, and reactive to light.  Cardiovascular:     Rate and Rhythm: Normal rate and regular rhythm.     Pulses: Normal pulses.     Heart sounds: Normal heart sounds. No murmur heard.    No gallop.  Pulmonary:     Effort: Pulmonary effort is normal. No respiratory distress.     Breath sounds: Normal breath sounds. No wheezing.  Skin:    General: Skin is warm and dry.  Neurological:     Mental Status: She is alert and oriented to person, place, and time.  Psychiatric:        Judgment: Judgment normal.     BP 110/60 (BP Location: Left Arm, Patient Position: Sitting, Cuff Size: Large)   Pulse 80   Temp 98 F (36.7 C) (Oral)   Resp 18   Ht '5\' 4"'$  (1.626 m)   Wt 206 lb 3.2 oz (93.5 kg)   SpO2 99%   BMI 35.39 kg/m  Wt Readings from Last 3 Encounters:  12/07/21 206 lb 3.2 oz (93.5 kg)  07/20/21 207 lb (93.9 kg)  06/16/21 205 lb 12.8  oz (93.4 kg)    Diabetic Foot Exam - Simple   No data filed    Lab Results  Component Value Date   WBC 5.6 12/07/2021   HGB 13.6 12/07/2021   HCT 40.5 12/07/2021  PLT 216.0 12/07/2021   GLUCOSE 107 (H) 12/07/2021   CHOL 147 12/07/2021   TRIG 118.0 12/07/2021   HDL 61.70 12/07/2021   LDLCALC 62 12/07/2021   ALT 16 12/07/2021   AST 17 12/07/2021   NA 140 12/07/2021   K 4.3 12/07/2021   CL 104 12/07/2021   CREATININE 0.98 12/07/2021   BUN 21 12/07/2021   CO2 28 12/07/2021   TSH 1.65 12/07/2021    Lab Results  Component Value Date   TSH 1.65 12/07/2021   Lab Results  Component Value Date   WBC 5.6 12/07/2021   HGB 13.6 12/07/2021   HCT 40.5 12/07/2021   MCV 94.2 12/07/2021   PLT 216.0 12/07/2021   Lab Results  Component Value Date   NA 140 12/07/2021   K 4.3 12/07/2021   CO2 28 12/07/2021   GLUCOSE 107 (H) 12/07/2021   BUN 21 12/07/2021   CREATININE 0.98 12/07/2021   BILITOT 0.6 12/07/2021   ALKPHOS 96 12/07/2021   AST 17 12/07/2021   ALT 16 12/07/2021   PROT 6.7 12/07/2021   ALBUMIN 4.4 12/07/2021   CALCIUM 9.9 12/07/2021   GFR 52.94 (L) 12/07/2021   Lab Results  Component Value Date   CHOL 147 12/07/2021   Lab Results  Component Value Date   HDL 61.70 12/07/2021   Lab Results  Component Value Date   LDLCALC 62 12/07/2021   Lab Results  Component Value Date   TRIG 118.0 12/07/2021   Lab Results  Component Value Date   CHOLHDL 2 12/07/2021   No results found for: "HGBA1C"     Assessment & Plan:   Problem List Items Addressed This Visit       Unprioritized   Hypothyroidism - Primary   Relevant Medications   levothyroxine (SYNTHROID) 88 MCG tablet   Other Relevant Orders   TSH (Completed)   Impacted cerumen of left ear    Irrigated with no complications Unable to use hoop       Hyperlipidemia    Encourage heart healthy diet such as MIND or DASH diet, increase exercise, avoid trans fats, simple carbohydrates and processed  foods, consider a krill or fish or flaxseed oil cap daily.       Relevant Orders   Comprehensive metabolic panel (Completed)   Lipid panel (Completed)   TSH (Completed)   CBC with Differential/Platelet (Completed)      Meds ordered this encounter  Medications   levothyroxine (SYNTHROID) 88 MCG tablet    Sig: Take 1 tablet (88 mcg total) by mouth daily before breakfast.    Dispense:  90 tablet    Refill:  3    Requesting 1 year supply    I, Ann Held, DO, personally preformed the services described in this documentation.  All medical record entries made by the scribe were at my direction and in my presence.  I have reviewed the chart and discharge instructions (if applicable) and agree that the record reflects my personal performance and is accurate and complete. 12/07/2021   I,Ralyn Stlaurent R Lowne Chase,acting as a scribe for Ann Held, DO.,have documented all relevant documentation on the behalf of Ann Held, DO,as directed by  Ann Held, DO while in the presence of Ann Held, DO.    Ann Held, DO

## 2021-12-07 NOTE — Assessment & Plan Note (Signed)
Encourage heart healthy diet such as MIND or DASH diet, increase exercise, avoid trans fats, simple carbohydrates and processed foods, consider a krill or fish or flaxseed oil cap daily.  °

## 2021-12-07 NOTE — Assessment & Plan Note (Signed)
Re ear irrigated  Tm I b/l  No errythema

## 2021-12-07 NOTE — Assessment & Plan Note (Signed)
Irrigated with no complications Unable to use hoop

## 2021-12-14 ENCOUNTER — Ambulatory Visit: Payer: Medicare Other | Admitting: Family Medicine

## 2022-01-09 ENCOUNTER — Other Ambulatory Visit: Payer: Self-pay | Admitting: Family Medicine

## 2022-01-09 DIAGNOSIS — I10 Essential (primary) hypertension: Secondary | ICD-10-CM

## 2022-04-13 ENCOUNTER — Other Ambulatory Visit: Payer: Self-pay | Admitting: Family Medicine

## 2022-04-13 DIAGNOSIS — E785 Hyperlipidemia, unspecified: Secondary | ICD-10-CM

## 2022-06-08 ENCOUNTER — Encounter: Payer: Medicare Other | Admitting: Family Medicine

## 2022-06-18 ENCOUNTER — Ambulatory Visit (INDEPENDENT_AMBULATORY_CARE_PROVIDER_SITE_OTHER): Payer: Medicare Other | Admitting: Family Medicine

## 2022-06-18 ENCOUNTER — Encounter: Payer: Medicare Other | Admitting: Family Medicine

## 2022-06-18 ENCOUNTER — Encounter: Payer: Self-pay | Admitting: Family Medicine

## 2022-06-18 VITALS — BP 132/60 | HR 94 | Temp 97.8°F | Resp 18 | Ht 64.0 in | Wt 206.8 lb

## 2022-06-18 DIAGNOSIS — E785 Hyperlipidemia, unspecified: Secondary | ICD-10-CM | POA: Diagnosis not present

## 2022-06-18 DIAGNOSIS — E039 Hypothyroidism, unspecified: Secondary | ICD-10-CM | POA: Diagnosis not present

## 2022-06-18 DIAGNOSIS — Z0000 Encounter for general adult medical examination without abnormal findings: Secondary | ICD-10-CM | POA: Diagnosis not present

## 2022-06-18 DIAGNOSIS — H6121 Impacted cerumen, right ear: Secondary | ICD-10-CM

## 2022-06-18 LAB — CBC WITH DIFFERENTIAL/PLATELET
Basophils Absolute: 0 10*3/uL (ref 0.0–0.1)
Basophils Relative: 0.5 % (ref 0.0–3.0)
Eosinophils Absolute: 0.1 10*3/uL (ref 0.0–0.7)
Eosinophils Relative: 2.1 % (ref 0.0–5.0)
HCT: 40.2 % (ref 36.0–46.0)
Hemoglobin: 13.6 g/dL (ref 12.0–15.0)
Lymphocytes Relative: 34.4 % (ref 12.0–46.0)
Lymphs Abs: 2.2 10*3/uL (ref 0.7–4.0)
MCHC: 33.9 g/dL (ref 30.0–36.0)
MCV: 92.9 fl (ref 78.0–100.0)
Monocytes Absolute: 0.6 10*3/uL (ref 0.1–1.0)
Monocytes Relative: 8.7 % (ref 3.0–12.0)
Neutro Abs: 3.5 10*3/uL (ref 1.4–7.7)
Neutrophils Relative %: 54.3 % (ref 43.0–77.0)
Platelets: 226 10*3/uL (ref 150.0–400.0)
RBC: 4.33 Mil/uL (ref 3.87–5.11)
RDW: 13.2 % (ref 11.5–15.5)
WBC: 6.5 10*3/uL (ref 4.0–10.5)

## 2022-06-18 LAB — TSH: TSH: 4.06 u[IU]/mL (ref 0.35–5.50)

## 2022-06-18 LAB — COMPREHENSIVE METABOLIC PANEL
ALT: 13 U/L (ref 0–35)
AST: 14 U/L (ref 0–37)
Albumin: 4.2 g/dL (ref 3.5–5.2)
Alkaline Phosphatase: 95 U/L (ref 39–117)
BUN: 18 mg/dL (ref 6–23)
CO2: 29 mEq/L (ref 19–32)
Calcium: 9.6 mg/dL (ref 8.4–10.5)
Chloride: 102 mEq/L (ref 96–112)
Creatinine, Ser: 0.94 mg/dL (ref 0.40–1.20)
GFR: 55.45 mL/min — ABNORMAL LOW (ref >60.00)
Glucose, Bld: 102 mg/dL — ABNORMAL HIGH (ref 70–99)
Potassium: 3.9 mEq/L (ref 3.5–5.1)
Sodium: 140 mEq/L (ref 135–145)
Total Bilirubin: 0.5 mg/dL (ref 0.2–1.2)
Total Protein: 6.7 g/dL (ref 6.0–8.3)

## 2022-06-18 LAB — LIPID PANEL
Cholesterol: 151 mg/dL (ref 0–200)
HDL: 62.3 mg/dL (ref >39.00)
LDL Cholesterol: 66 mg/dL (ref 0–99)
NonHDL: 88.55
Total CHOL/HDL Ratio: 2
Triglycerides: 115 mg/dL (ref 0.0–149.0)
VLDL: 23 mg/dL (ref 0.0–40.0)

## 2022-06-18 NOTE — Assessment & Plan Note (Signed)
Irrigated -- able to move but not remove wax Pt felt better and it cleared her hearing

## 2022-06-18 NOTE — Assessment & Plan Note (Signed)
Stable  Con't synthroid and check labs

## 2022-06-18 NOTE — Progress Notes (Addendum)
Subjective:   By signing my name below, I, Shehryar Baig, attest that this documentation has been prepared under the direction and in the presence of Ann Held, DO. 06/18/2022   Patient ID: Carla Reilly, female    DOB: 1934/08/16, 88 y.o.   MRN: [id number]  Chief Complaint  Patient presents with   Annual Exam    Pt states fasting     HPI Patient is in today for a comprehensive physical exam.   She reports having fullness in her right ear due to cerumen.  She denies having any fever, new moles, congestion, sore throat, new muscle pain, new joint pain, chest pain, cough, SOB, wheezing, n/v/d, constipation, blood in stool, dysuria, frequency, hematuria, or headaches at this time. She reports her older sister passed away from dementia and stroke. Her brother passed away from a cerebral hemorrhage, otherwise she has no changes to her family medical history.   She is UTD on flu vaccine this year and reports receiving it October 2023. She has 5 Covid-19 vaccines but not the latest booster vaccine. She does not have the RSV vaccine and is not interested in receiving it at this time.  Mammogram was last completed 06/07/2021. Results are normal. Repeat in 1 year. She does not have an upcomming appointment scheduled. .  She is UTD on vision care. She is planning on scheduling a dental visit.    Past Medical History:  Diagnosis Date   Carpal tunnel syndrome    Cataracts, bilateral    Hyperlipidemia    Thyroid disease    Hypothyroidism   Wears dentures    Wears glasses     Past Surgical History:  Procedure Laterality Date   CATARACTS REMOVED OCT & NOV 2017     OCT & NOV 2017   DILATION AND CURETTAGE OF UTERUS  1980   HYSTEROSCOPY WITH D & C N/A 03/16/2015   Procedure: DILATATION AND CURETTAGE /HYSTEROSCOPY;  Surgeon: Maisie Fus, MD;  Location: [facility];  Service: Gynecology;  Laterality: N/A;   TONSILLECTOMY     TOTAL ABDOMINAL HYSTERECTOMY W/ BILATERAL  SALPINGOOPHORECTOMY Bilateral 07/2015   Dr Hurley Cisco    Family History  Problem Relation Age of Onset   Hypothyroidism Mother    Dementia Mother        died at 2yo   Prostate cancer Father    Cancer Father        prostate   Dementia Sister    Stroke Sister    Lung cancer Sister    Hypertension Brother    Intracerebral hemorrhage Brother    Colon cancer Son 46   Testicular cancer Grandchild     Social History   Socioeconomic History   Marital status: Widowed    Spouse name: Not on file   Number of children: 2   Years of education: 75   Highest education level: Not on file  Occupational History    Employer: [employer]    Comment: retired 12/2014  Tobacco Use   Smoking status: Former    Types: Cigarettes    Quit date: 06/04/1972    Years since quitting: 50.0   Smokeless tobacco: Never  Substance and Sexual Activity   Alcohol use: No   Drug use: No   Sexual activity: Yes    Partners: Male  Other Topics Concern   Not on file  Social History Narrative   Pt lives alone in a house    Social Determinants of Health  Financial Resource Strain: Not on file  Food Insecurity: Not on file  Transportation Needs: Not on file  Physical Activity: Not on file  Stress: Not on file  Social Connections: Not on file  Intimate Partner Violence: Not on file    Outpatient Medications Prior to Visit  Medication Sig Dispense Refill   Coenzyme Q10 (VITALINE COQ10 PO) Take 600 mg by mouth.     EPINEPHrine 0.3 mg/0.3 mL IJ SOAJ injection Inject 0.3 mLs (0.3 mg total) into the muscle once. 3 Device 0   levothyroxine (SYNTHROID) 88 MCG tablet Take 1 tablet (88 mcg total) by mouth daily before breakfast. 90 tablet 3   losartan-hydrochlorothiazide (HYZAAR) 50-12.5 MG tablet TAKE 1 TABLET BY MOUTH DAILY 100 tablet 2   simvastatin (ZOCOR) 40 MG tablet TAKE 1 TABLET BY MOUTH AT  BEDTIME 100 tablet 2   Lutein 40 MG CAPS Take 1 capsule by mouth daily.     Specialty Vitamins Products (RA EAR CARE)  TABS Take by mouth as needed.      No facility-administered medications prior to visit.    Allergies  Allergen Reactions   Yellow Jacket Venom [Bee Venom] Hives   Zestoretic [Lisinopril-Hydrochlorothiazide] Cough    Review of Systems  Constitutional:  Negative for fever and malaise/fatigue.  HENT:  Negative for congestion, sinus pain and sore throat.   Eyes:  Negative for blurred vision.  Respiratory:  Negative for cough, shortness of breath and wheezing.   Cardiovascular:  Negative for chest pain, palpitations and leg swelling.  Gastrointestinal:  Negative for abdominal pain, constipation, diarrhea, nausea and vomiting.  Genitourinary:  Negative for dysuria, frequency and hematuria.  Musculoskeletal:  Negative for back pain.       (-)new muscle pain (-)new joint pain  Skin:  Negative for rash.       (-)New moles  Neurological:  Negative for loss of consciousness and headaches.       Objective:    Physical Exam Vitals and nursing note reviewed.  Constitutional:      General: She is not in acute distress.    Appearance: Normal appearance. She is not ill-appearing.  HENT:     Head: Normocephalic and atraumatic.     Right Ear: Tympanic membrane, ear canal and external ear normal. There is impacted cerumen.     Left Ear: Tympanic membrane, ear canal and external ear normal.     Ears:     Comments: Attempted to irrigate the R ear ---- unable to remove wax but it did move TMI     Nose: Nose normal.     Mouth/Throat:     Pharynx: No oropharyngeal exudate or posterior oropharyngeal erythema.  Eyes:     Extraocular Movements: Extraocular movements intact.     Pupils: Pupils are equal, round, and reactive to light.  Cardiovascular:     Rate and Rhythm: Normal rate and regular rhythm.     Heart sounds: Normal heart sounds. No murmur heard.    No gallop.  Pulmonary:     Effort: Pulmonary effort is normal. No respiratory distress.     Breath sounds: Normal breath sounds. No  wheezing or rales.  Abdominal:     General: Bowel sounds are normal. There is no distension.     Palpations: Abdomen is soft.     Tenderness: There is no abdominal tenderness. There is no guarding.  Musculoskeletal:        General: No tenderness. Normal range of motion.     Cervical  back: Normal range of motion and neck supple.  Skin:    General: Skin is warm and dry.  Neurological:     General: No focal deficit present.     Mental Status: She is alert and oriented to person, place, and time.     Gait: Gait normal.  Psychiatric:        Mood and Affect: Mood normal.        Thought Content: Thought content normal.        Judgment: Judgment normal.     BP 132/60 (BP Location: Right Arm, Patient Position: Sitting, Cuff Size: Large)   Pulse 94   Temp 97.8 F (36.6 C) (Oral)   Resp 18   Ht '5\' 4"'$  (1.626 m)   Wt 206 lb 12.8 oz (93.8 kg)   SpO2 96%   BMI 35.50 kg/m  Wt Readings from Last 3 Encounters:  06/18/22 206 lb 12.8 oz (93.8 kg)  12/07/21 206 lb 3.2 oz (93.5 kg)  07/20/21 207 lb (93.9 kg)       Assessment & Plan:  Preventative health care  Hypothyroidism, unspecified type Assessment & Plan: Stable  Con't synthroid and check labs   Orders: -     CBC with Differential/Platelet -     Comprehensive metabolic panel -     Lipid panel -     TSH  Hyperlipidemia, unspecified hyperlipidemia type Assessment & Plan: Encourage heart healthy diet such as MIND or DASH diet, increase exercise, avoid trans fats, simple carbohydrates and processed foods, consider a krill or fish or flaxseed oil cap daily.    Orders: -     Comprehensive metabolic panel -     Lipid panel -     TSH  Impacted cerumen of right ear Assessment & Plan: Irrigated -- able to move but not remove wax Pt felt better and it cleared her hearing      I, Ann Held, DO, personally preformed the services described in this documentation.  All medical record entries made by the scribe were at  my direction and in my presence.  I have reviewed the chart and discharge instructions (if applicable) and agree that the record reflects my personal performance and is accurate and complete. 06/18/2022  Ann Held, DO

## 2022-06-18 NOTE — Assessment & Plan Note (Signed)
Encourage heart healthy diet such as MIND or DASH diet, increase exercise, avoid trans fats, simple carbohydrates and processed foods, consider a krill or fish or flaxseed oil cap daily.  °

## 2022-06-18 NOTE — Patient Instructions (Signed)
Preventive Care 65 Years and Older, Female Preventive care refers to lifestyle choices and visits with your health care provider that can promote health and wellness. Preventive care visits are also called wellness exams. What can I expect for my preventive care visit? Counseling Your health care provider may ask you questions about your: Medical history, including: Past medical problems. Family medical history. Pregnancy and menstrual history. History of falls. Current health, including: Memory and ability to understand (cognition). Emotional well-being. Home life and relationship well-being. Sexual activity and sexual health. Lifestyle, including: Alcohol, nicotine or tobacco, and drug use. Access to firearms. Diet, exercise, and sleep habits. Work and work environment. Sunscreen use. Safety issues such as seatbelt and bike helmet use. Physical exam Your health care provider will check your: Height and weight. These may be used to calculate your BMI (body mass index). BMI is a measurement that tells if you are at a healthy weight. Waist circumference. This measures the distance around your waistline. This measurement also tells if you are at a healthy weight and may help predict your risk of certain diseases, such as type 2 diabetes and high blood pressure. Heart rate and blood pressure. Body temperature. Skin for abnormal spots. What immunizations do I need?  Vaccines are usually given at various ages, according to a schedule. Your health care provider will recommend vaccines for you based on your age, medical history, and lifestyle or other factors, such as travel or where you work. What tests do I need? Screening Your health care provider may recommend screening tests for certain conditions. This may include: Lipid and cholesterol levels. Hepatitis C test. Hepatitis B test. HIV (human immunodeficiency virus) test. STI (sexually transmitted infection) testing, if you are at  risk. Lung cancer screening. Colorectal cancer screening. Diabetes screening. This is done by checking your blood sugar (glucose) after you have not eaten for a while (fasting). Mammogram. Talk with your health care provider about how often you should have regular mammograms. BRCA-related cancer screening. This may be done if you have a family history of breast, ovarian, tubal, or peritoneal cancers. Bone density scan. This is done to screen for osteoporosis. Talk with your health care provider about your test results, treatment options, and if necessary, the need for more tests. Follow these instructions at home: Eating and drinking  Eat a diet that includes fresh fruits and vegetables, whole grains, lean protein, and low-fat dairy products. Limit your intake of foods with high amounts of sugar, saturated fats, and salt. Take vitamin and mineral supplements as recommended by your health care provider. Do not drink alcohol if your health care provider tells you not to drink. If you drink alcohol: Limit how much you have to 0-1 drink a day. Know how much alcohol is in your drink. In the [place] one drink equals one 12 oz bottle of beer (355 mL), one 5 oz glass of wine (148 mL), or one 1 oz glass of hard liquor (44 mL). Lifestyle Brush your teeth every morning and night with fluoride toothpaste. Floss one time each day. Exercise for at least 30 minutes 5 or more days each week. Do not use any products that contain nicotine or tobacco. These products include cigarettes, chewing tobacco, and vaping devices, such as e-cigarettes. If you need help quitting, ask your health care provider. Do not use drugs. If you are sexually active, practice safe sex. Use a condom or other form of protection in order to prevent STIs. Take aspirin only as told by   your health care provider. Make sure that you understand how much to take and what form to take. Work with your health care provider to find out whether it  is safe and beneficial for you to take aspirin daily. Ask your health care provider if you need to take a cholesterol-lowering medicine (statin). Find healthy ways to manage stress, such as: Meditation, yoga, or listening to music. Journaling. Talking to a trusted person. Spending time with friends and family. Minimize exposure to UV radiation to reduce your risk of skin cancer. Safety Always wear your seat belt while driving or riding in a vehicle. Do not drive: If you have been drinking alcohol. Do not ride with someone who has been drinking. When you are tired or distracted. While texting. If you have been using any mind-altering substances or drugs. Wear a helmet and other protective equipment during sports activities. If you have firearms in your house, make sure you follow all gun safety procedures. What's next? Visit your health care provider once a year for an annual wellness visit. Ask your health care provider how often you should have your eyes and teeth checked. Stay up to date on all vaccines. This information is not intended to replace advice given to you by your health care provider. Make sure you discuss any questions you have with your health care provider. Document Revised: 11/16/2020 Document Reviewed: 11/16/2020 Elsevier Patient Education  2023 Elsevier Inc.  

## 2022-06-27 ENCOUNTER — Other Ambulatory Visit (HOSPITAL_BASED_OUTPATIENT_CLINIC_OR_DEPARTMENT_OTHER): Payer: Self-pay | Admitting: Family Medicine

## 2022-06-27 DIAGNOSIS — Z1231 Encounter for screening mammogram for malignant neoplasm of breast: Secondary | ICD-10-CM

## 2022-07-04 ENCOUNTER — Ambulatory Visit (HOSPITAL_BASED_OUTPATIENT_CLINIC_OR_DEPARTMENT_OTHER)
Admission: RE | Admit: 2022-07-04 | Discharge: 2022-07-04 | Disposition: A | Discharge status: Home / Self Care | Payer: Medicare Other | Source: Ambulatory Visit | Attending: Family Medicine | Admitting: Family Medicine

## 2022-07-04 ENCOUNTER — Encounter (HOSPITAL_BASED_OUTPATIENT_CLINIC_OR_DEPARTMENT_OTHER): Payer: Self-pay

## 2022-07-04 DIAGNOSIS — Z1231 Encounter for screening mammogram for malignant neoplasm of breast: Secondary | ICD-10-CM | POA: Diagnosis not present

## 2022-07-06 ENCOUNTER — Other Ambulatory Visit: Payer: Self-pay | Admitting: Family Medicine

## 2022-07-06 DIAGNOSIS — R928 Other abnormal and inconclusive findings on diagnostic imaging of breast: Secondary | ICD-10-CM

## 2022-07-16 ENCOUNTER — Other Ambulatory Visit: Payer: Medicare Other

## 2022-07-19 ENCOUNTER — Other Ambulatory Visit: Payer: Self-pay | Admitting: Family Medicine

## 2022-07-19 ENCOUNTER — Ambulatory Visit
Admission: RE | Admit: 2022-07-19 | Discharge: 2022-07-19 | Disposition: A | Discharge status: Home / Self Care | Payer: Medicare Other | Source: Ambulatory Visit | Attending: Family Medicine | Admitting: Family Medicine

## 2022-07-19 DIAGNOSIS — R928 Other abnormal and inconclusive findings on diagnostic imaging of breast: Secondary | ICD-10-CM

## 2022-07-19 DIAGNOSIS — N6313 Unspecified lump in the right breast, lower outer quadrant: Secondary | ICD-10-CM | POA: Diagnosis not present

## 2022-07-19 DIAGNOSIS — N6310 Unspecified lump in the right breast, unspecified quadrant: Secondary | ICD-10-CM

## 2022-07-19 DIAGNOSIS — N6311 Unspecified lump in the right breast, upper outer quadrant: Secondary | ICD-10-CM | POA: Diagnosis not present

## 2022-07-31 ENCOUNTER — Other Ambulatory Visit: Payer: Medicare Other

## 2022-09-07 ENCOUNTER — Other Ambulatory Visit: Payer: Self-pay | Admitting: Family Medicine

## 2022-09-07 DIAGNOSIS — E039 Hypothyroidism, unspecified: Secondary | ICD-10-CM

## 2022-10-02 ENCOUNTER — Other Ambulatory Visit: Payer: Self-pay | Admitting: Family Medicine

## 2022-10-02 DIAGNOSIS — I10 Essential (primary) hypertension: Secondary | ICD-10-CM

## 2022-11-16 ENCOUNTER — Other Ambulatory Visit: Payer: Self-pay | Admitting: Family Medicine

## 2022-11-16 DIAGNOSIS — E039 Hypothyroidism, unspecified: Secondary | ICD-10-CM

## 2022-12-06 ENCOUNTER — Other Ambulatory Visit: Payer: Self-pay | Admitting: Family Medicine

## 2022-12-06 DIAGNOSIS — E785 Hyperlipidemia, unspecified: Secondary | ICD-10-CM

## 2022-12-08 ENCOUNTER — Other Ambulatory Visit: Payer: Self-pay | Admitting: Family Medicine

## 2022-12-08 DIAGNOSIS — I10 Essential (primary) hypertension: Secondary | ICD-10-CM

## 2022-12-17 ENCOUNTER — Encounter: Payer: Self-pay | Admitting: Family Medicine

## 2022-12-17 ENCOUNTER — Ambulatory Visit (INDEPENDENT_AMBULATORY_CARE_PROVIDER_SITE_OTHER): Payer: Medicare Other | Admitting: Family Medicine

## 2022-12-17 VITALS — BP 136/88 | HR 88 | Temp 98.2°F | Resp 18 | Ht 64.0 in | Wt 206.0 lb

## 2022-12-17 DIAGNOSIS — E785 Hyperlipidemia, unspecified: Secondary | ICD-10-CM | POA: Diagnosis not present

## 2022-12-17 DIAGNOSIS — E039 Hypothyroidism, unspecified: Secondary | ICD-10-CM

## 2022-12-17 DIAGNOSIS — I10 Essential (primary) hypertension: Secondary | ICD-10-CM | POA: Diagnosis not present

## 2022-12-17 LAB — CBC WITH DIFFERENTIAL/PLATELET
Basophils Absolute: 0 10*3/uL (ref 0.0–0.1)
Basophils Relative: 0.6 % (ref 0.0–3.0)
Eosinophils Absolute: 0.2 10*3/uL (ref 0.0–0.7)
Eosinophils Relative: 2.9 % (ref 0.0–5.0)
HCT: 38.6 % (ref 36.0–46.0)
Hemoglobin: 13.2 g/dL (ref 12.0–15.0)
Lymphocytes Relative: 36.3 % (ref 12.0–46.0)
Lymphs Abs: 2 10*3/uL (ref 0.7–4.0)
MCHC: 34.1 g/dL (ref 30.0–36.0)
MCV: 92.6 fl (ref 78.0–100.0)
Monocytes Absolute: 0.5 10*3/uL (ref 0.1–1.0)
Monocytes Relative: 9.1 % (ref 3.0–12.0)
Neutro Abs: 2.8 10*3/uL (ref 1.4–7.7)
Neutrophils Relative %: 51.1 % (ref 43.0–77.0)
Platelets: 201 10*3/uL (ref 150.0–400.0)
RBC: 4.17 Mil/uL (ref 3.87–5.11)
RDW: 13.3 % (ref 11.5–15.5)
WBC: 5.6 10*3/uL (ref 4.0–10.5)

## 2022-12-17 LAB — LIPID PANEL
Cholesterol: 148 mg/dL (ref 0–200)
HDL: 56.9 mg/dL (ref >39.00)
LDL Cholesterol: 63 mg/dL (ref 0–99)
NonHDL: 90.77
Total CHOL/HDL Ratio: 3
Triglycerides: 139 mg/dL (ref 0.0–149.0)
VLDL: 27.8 mg/dL (ref 0.0–40.0)

## 2022-12-17 LAB — COMPREHENSIVE METABOLIC PANEL
ALT: 15 U/L (ref 0–35)
AST: 16 U/L (ref 0–37)
Albumin: 4.1 g/dL (ref 3.5–5.2)
Alkaline Phosphatase: 83 U/L (ref 39–117)
BUN: 19 mg/dL (ref 6–23)
CO2: 27 mEq/L (ref 19–32)
Calcium: 9.5 mg/dL (ref 8.4–10.5)
Chloride: 104 mEq/L (ref 96–112)
Creatinine, Ser: 0.97 mg/dL (ref 0.40–1.20)
GFR: 53.21 mL/min — ABNORMAL LOW (ref >60.00)
Glucose, Bld: 101 mg/dL — ABNORMAL HIGH (ref 70–99)
Potassium: 3.9 mEq/L (ref 3.5–5.1)
Sodium: 139 mEq/L (ref 135–145)
Total Bilirubin: 0.6 mg/dL (ref 0.2–1.2)
Total Protein: 6.5 g/dL (ref 6.0–8.3)

## 2022-12-17 LAB — TSH: TSH: 1.94 u[IU]/mL (ref 0.35–5.50)

## 2022-12-17 NOTE — Assessment & Plan Note (Signed)
 Tolerating statin, encouraged heart healthy diet, avoid trans fats, minimize simple carbs and saturated fats. Increase exercise as tolerated 

## 2022-12-17 NOTE — Assessment & Plan Note (Signed)
Cont synthroid Check labs  

## 2022-12-17 NOTE — Patient Instructions (Signed)

## 2022-12-17 NOTE — Progress Notes (Signed)
Established Patient Office Visit  Subjective   Patient ID: Carla Reilly, female    DOB: Aug 15, 1934  Age: 88 y.o. MRN: [id number]  Chief Complaint  Patient presents with   Hypertension   Hyperlipidemia   Hypothyroidism   Follow-up    HPI Discussed the use of AI scribe software for clinical note transcription with the patient, who gave verbal consent to proceed.  History of Present Illness   The patient, with a history of hypertension, hyperlipidemia, and hypothyroidism, presents for a routine follow-up. They report no new symptoms or concerns. They have been adhering to their medication regimen, which includes thyroid medication, antihypertensives, and cholesterol-lowering drugs. They have not noticed any swelling in their ankles, except on humid days when they wear shoes.      Patient Active Problem List   Diagnosis Date Noted   Essential hypertension 12/17/2022   Impacted cerumen of left ear 12/07/2021   Impacted cerumen of right ear 06/16/2021   Numerous skin moles 07/01/2019   Preventative health care 05/20/2018   Hyperlipidemia 05/20/2018   Hypothyroidism 05/20/2018   Post-menopausal bleeding 03/16/2015   Obesity (BMI 30-39.9) 10/16/2013   Hematuria 06/10/2012   Osteopenia 07/15/2009   HYPERLIPIDEMIA 08/08/2007   HYPOTHYROIDISM 10/30/2006   Past Medical History:  Diagnosis Date   Carpal tunnel syndrome    Cataracts, bilateral    Hyperlipidemia    Thyroid disease    Hypothyroidism   Wears dentures    Wears glasses    Past Surgical History:  Procedure Laterality Date   CATARACTS REMOVED OCT & NOV 2017     OCT & NOV 2017   DILATION AND CURETTAGE OF UTERUS  1980   HYSTEROSCOPY WITH D & C N/A 03/16/2015   Procedure: DILATATION AND CURETTAGE /HYSTEROSCOPY;  Surgeon: Freddy Finner, MD;  Location: [facility];  Service: Gynecology;  Laterality: N/A;   TONSILLECTOMY     TOTAL ABDOMINAL HYSTERECTOMY W/ BILATERAL SALPINGOOPHORECTOMY Bilateral 07/2015    Dr Ishmael Holter   Social History   Tobacco Use   Smoking status: Former    Current packs/day: 0.00    Types: Cigarettes    Quit date: 06/04/1972    Years since quitting: 50.5   Smokeless tobacco: Never  Substance Use Topics   Alcohol use: No   Drug use: No   Social History   Socioeconomic History   Marital status: Widowed    Spouse name: Not on file   Number of children: 2   Years of education: 8   Highest education level: Not on file  Occupational History    Employer: [employer]    Comment: retired 12/2014  Tobacco Use   Smoking status: Former    Current packs/day: 0.00    Types: Cigarettes    Quit date: 06/04/1972    Years since quitting: 50.5   Smokeless tobacco: Never  Substance and Sexual Activity   Alcohol use: No   Drug use: No   Sexual activity: Yes    Partners: Male  Other Topics Concern   Not on file  Social History Narrative   Pt lives alone in a house    Social Determinants of Health   Financial Resource Strain: Not on file  Food Insecurity: Not on file  Transportation Needs: Not on file  Physical Activity: Not on file  Stress: Not on file  Social Connections: Not on file  Intimate Partner Violence: Not on file   Family Status  Relation Name Status   Mother  Deceased  Father  Deceased   Sister  Deceased   Sister  Alive   Brother  Deceased   Son  Deceased   GChild christopher Alive  No partnership data on file   Family History  Problem Relation Age of Onset   Hypothyroidism Mother    Dementia Mother        died at 21yo   Prostate cancer Father    Cancer Father        prostate   Dementia Sister    Stroke Sister    Lung cancer Sister    Hypertension Brother    Intracerebral hemorrhage Brother    Colon cancer Son 60   Testicular cancer Grandchild    Allergies  Allergen Reactions   Yellow Jacket Venom [Bee Venom] Hives   Zestoretic [Lisinopril-Hydrochlorothiazide] Cough      Review of Systems  Constitutional:  Negative for fever.   HENT:  Negative for congestion.   Eyes:  Negative for blurred vision.  Respiratory:  Negative for cough.   Cardiovascular:  Negative for chest pain and palpitations.  Gastrointestinal:  Negative for vomiting.  Musculoskeletal:  Negative for back pain.  Skin:  Negative for rash.  Neurological:  Negative for loss of consciousness and headaches.      Objective:     BP 136/88 (BP Location: Right Arm, Patient Position: Sitting, Cuff Size: Large)   Pulse 88   Temp 98.2 F (36.8 C) (Oral)   Resp 18   Ht 5\' 4"  (1.626 m)   Wt 206 lb (93.4 kg)   SpO2 98%   BMI 35.36 kg/m  BP Readings from Last 3 Encounters:  12/17/22 136/88  06/18/22 132/60  12/07/21 110/60   Wt Readings from Last 3 Encounters:  12/17/22 206 lb (93.4 kg)  06/18/22 206 lb 12.8 oz (93.8 kg)  12/07/21 206 lb 3.2 oz (93.5 kg)   SpO2 Readings from Last 3 Encounters:  12/17/22 98%  06/18/22 96%  12/07/21 99%      Physical Exam Vitals and nursing note reviewed.  Constitutional:      General: She is not in acute distress.    Appearance: Normal appearance. She is well-developed.  HENT:     Head: Normocephalic and atraumatic.  Eyes:     General: No scleral icterus.       Right eye: No discharge.        Left eye: No discharge.  Cardiovascular:     Rate and Rhythm: Normal rate and regular rhythm.     Heart sounds: No murmur heard. Pulmonary:     Effort: Pulmonary effort is normal. No respiratory distress.     Breath sounds: Normal breath sounds.  Musculoskeletal:        General: Normal range of motion.     Cervical back: Normal range of motion and neck supple.     Right lower leg: No edema.     Left lower leg: No edema.  Skin:    General: Skin is warm and dry.  Neurological:     Mental Status: She is alert and oriented to person, place, and time.  Psychiatric:        Mood and Affect: Mood normal.        Behavior: Behavior normal.        Thought Content: Thought content normal.        Judgment:  Judgment normal.      No results found for any visits on 12/17/22.  Last CBC Lab Results  Component Value  Date   WBC 6.5 06/18/2022   HGB 13.6 06/18/2022   HCT 40.2 06/18/2022   MCV 92.9 06/18/2022   MCH 31.3 01/25/2017   RDW 13.2 06/18/2022   PLT 226.0 06/18/2022   Last metabolic panel Lab Results  Component Value Date   GLUCOSE 102 (H) 06/18/2022   NA 140 06/18/2022   K 3.9 06/18/2022   CL 102 06/18/2022   CO2 29 06/18/2022   BUN 18 06/18/2022   CREATININE 0.94 06/18/2022   GFR 55.45 (L) 06/18/2022   CALCIUM 9.6 06/18/2022   PROT 6.7 06/18/2022   ALBUMIN 4.2 06/18/2022   BILITOT 0.5 06/18/2022   ALKPHOS 95 06/18/2022   AST 14 06/18/2022   ALT 13 06/18/2022   Last lipids Lab Results  Component Value Date   CHOL 151 06/18/2022   HDL 62.30 06/18/2022   LDLCALC 66 06/18/2022   TRIG 115.0 06/18/2022   CHOLHDL 2 06/18/2022   Last hemoglobin A1c No results found for: "HGBA1C" Last thyroid functions Lab Results  Component Value Date   TSH 4.06 06/18/2022   Last vitamin D Lab Results  Component Value Date   VD25OH 41 07/15/2009   Last vitamin B12 and Folate No results found for: "VITAMINB12", "FOLATE"    The ASCVD Risk score (Arnett DK, et al., 2019) failed to calculate for the following reasons:   The 2019 ASCVD risk score is only valid for ages 76 to 44    Assessment & Plan:   Problem List Items Addressed This Visit       Unprioritized   Hypothyroidism - Primary    Con't synthroid Check labs       Relevant Orders   TSH   Hyperlipidemia    Tolerating statin, encouraged heart healthy diet, avoid trans fats, minimize simple carbs and saturated fats. Increase exercise as tolerated       Relevant Orders   CBC with Differential/Platelet   Comprehensive metabolic panel   Lipid panel   Essential hypertension    Con't losartan and hydrochlorothiazide Well controlled, no changes to meds. Encouraged heart healthy diet such as the DASH diet and  exercise as tolerated.        Relevant Orders   CBC with Differential/Platelet   Comprehensive metabolic panel   Lipid panel  Assessment and Plan    Hypertension: Well controlled on current medication regimen. -Continue current medications.  Hyperlipidemia: Well controlled on current medication regimen. -Continue current medications.  Hypothyroidism: Well controlled on current medication regimen. -Continue Synthroid.  General Health Maintenance: -Order labs today. -Follow-up in six months.        Return in about 6 months (around 06/19/2023) for annual exam, fasting.    Donato Schultz, DO

## 2022-12-17 NOTE — Assessment & Plan Note (Signed)
Con't losartan and hydrochlorothiazide Well controlled, no changes to meds. Encouraged heart healthy diet such as the DASH diet and exercise as tolerated.

## 2023-01-15 ENCOUNTER — Other Ambulatory Visit: Payer: Self-pay | Admitting: Family Medicine

## 2023-01-15 DIAGNOSIS — E039 Hypothyroidism, unspecified: Secondary | ICD-10-CM

## 2023-01-23 ENCOUNTER — Telehealth: Payer: Self-pay | Admitting: Family Medicine

## 2023-01-23 NOTE — Telephone Encounter (Signed)
Pharmacy would like to know if it okay to switch the manufacturer for levothyroxine to alvogen. Ref # [id number].

## 2023-01-23 NOTE — Telephone Encounter (Signed)
Verbal given 

## 2023-02-09 ENCOUNTER — Other Ambulatory Visit: Payer: Self-pay | Admitting: Family Medicine

## 2023-02-09 DIAGNOSIS — I10 Essential (primary) hypertension: Secondary | ICD-10-CM

## 2023-03-13 ENCOUNTER — Other Ambulatory Visit: Payer: Self-pay | Admitting: Family Medicine

## 2023-03-13 DIAGNOSIS — E785 Hyperlipidemia, unspecified: Secondary | ICD-10-CM

## 2023-05-16 DIAGNOSIS — L309 Dermatitis, unspecified: Secondary | ICD-10-CM | POA: Diagnosis not present

## 2023-05-16 DIAGNOSIS — J309 Allergic rhinitis, unspecified: Secondary | ICD-10-CM | POA: Diagnosis not present

## 2023-05-22 ENCOUNTER — Other Ambulatory Visit: Payer: Self-pay | Admitting: Family Medicine

## 2023-05-22 DIAGNOSIS — E785 Hyperlipidemia, unspecified: Secondary | ICD-10-CM

## 2023-06-24 ENCOUNTER — Encounter: Payer: Medicare Other | Admitting: Family Medicine

## 2023-07-10 ENCOUNTER — Ambulatory Visit: Payer: Self-pay | Admitting: Family Medicine

## 2023-07-10 NOTE — Telephone Encounter (Signed)
 Called patient to let her know for any medication to be sent in, we would need an appt  Patient stated she needed to reschedule her  physical anyway and she would like to do them both at the same time. I explained to patient that the physical would not cover the leg pain & that the appointments would need to be separate. She stated she would just take motrin/tylenol for pain and then call back later on to rescheduled her physical when she is feeling better.

## 2023-07-10 NOTE — Telephone Encounter (Signed)
 Copied from CRM [id number]. Topic: Clinical - Red Word Triage >> Jul 10, 2023 10:51 AM Macario HERO wrote: Red Word that prompted transfer to Nurse Triage: Patient called stated she pulled a muscle in her right leg a few months ago and has discomfort when she walks or puts pressure on it.  Chief Complaint: right leg pain Symptoms: pain Frequency: pulled muscle in the fall working in yard Pertinent Negatives: Patient denies fever, weakness, numbness, tingling. Disposition: [] ED /[] Urgent Care (no appt availability in office) / [] Appointment(In office/virtual)/ []  [facility]/ [] Home Care/ [] Refused Recommended Disposition /[] [facility]/ [x]  Follow-up with PCP Additional Notes: declined apt.  States wants pcp to call something in for her.  Per protocol should be seen in 3 days.  Message sent to the office.   Reason for Disposition  [1] MODERATE pain (e.g., interferes with normal activities, limping) AND [2] present > 3 days  Answer Assessment - Initial Assessment Questions 1. ONSET: When did the pain start?      Pulled muscle in the fall working in the yard.  2. LOCATION: Where is the pain located?      Right leg - muscle pulled 3. PAIN: How bad is the pain?    (Scale 1-10; or mild, moderate, severe)   -  MILD (1-3): doesn't interfere with normal activities    -  MODERATE (4-7): interferes with normal activities (e.g., work or school) or awakens from sleep, limping    -  SEVERE (8-10): excruciating pain, unable to do any normal activities, unable to walk     severe 4. WORK OR EXERCISE: Has there been any recent work or exercise that involved this part of the body?      na 5. CAUSE: What do you think is causing the leg pain?     Pulled muscle 6. OTHER SYMPTOMS: Do you have any other symptoms? (e.g., chest pain, back pain, breathing difficulty, swelling, rash, fever, numbness, weakness)     Denies.  Protocols used: Leg Pain-A-AH

## 2023-07-15 ENCOUNTER — Encounter: Payer: Medicare Other | Admitting: Family Medicine

## 2023-09-30 ENCOUNTER — Other Ambulatory Visit: Payer: Self-pay | Admitting: Family Medicine

## 2023-09-30 DIAGNOSIS — E785 Hyperlipidemia, unspecified: Secondary | ICD-10-CM

## 2023-10-10 ENCOUNTER — Other Ambulatory Visit: Payer: Self-pay | Admitting: Family Medicine

## 2023-10-10 DIAGNOSIS — E039 Hypothyroidism, unspecified: Secondary | ICD-10-CM

## 2023-10-10 DIAGNOSIS — I10 Essential (primary) hypertension: Secondary | ICD-10-CM

## 2023-10-29 ENCOUNTER — Other Ambulatory Visit: Payer: Self-pay | Admitting: Family Medicine

## 2023-10-29 DIAGNOSIS — E039 Hypothyroidism, unspecified: Secondary | ICD-10-CM

## 2023-10-29 DIAGNOSIS — I10 Essential (primary) hypertension: Secondary | ICD-10-CM

## 2023-12-02 ENCOUNTER — Other Ambulatory Visit: Payer: Self-pay | Admitting: Family Medicine

## 2023-12-02 DIAGNOSIS — E785 Hyperlipidemia, unspecified: Secondary | ICD-10-CM

## 2024-01-31 ENCOUNTER — Encounter: Payer: Self-pay | Admitting: Pharmacist

## 2024-01-31 NOTE — Progress Notes (Signed)
 Pharmacy Quality Measure Review  This patient is appearing on a report for being at risk of failing the adherence measure for hypertension (ACEi/ARB) medications this calendar year.   Medication: losartan +HCTZ Last fill date: 10/15/2023 for 30 day supply Rx requests in June was denied because patient needed appt with PCP. Patient actually has plenty of losartan  hydrochlorothiazide  - she filled 100 Ds 08/06/2023.   Offered to make appt today, She said she will call next week to make appt.    Insurance report was not up to date. No action needed at this time.   Madelin Ray, PharmD Clinical Pharmacist [facility]  Population Health [phone]

## 2024-03-20 ENCOUNTER — Ambulatory Visit: Admitting: *Deleted

## 2024-03-20 ENCOUNTER — Telehealth: Payer: Self-pay | Admitting: *Deleted

## 2024-03-20 VITALS — Ht 64.0 in | Wt 195.0 lb

## 2024-03-20 DIAGNOSIS — Z0000 Encounter for general adult medical examination without abnormal findings: Secondary | ICD-10-CM | POA: Diagnosis not present

## 2024-03-20 NOTE — Patient Instructions (Signed)
 Carla Reilly , Thank you for taking time out of your busy schedule to complete your Annual Wellness Visit with me. I enjoyed our conversation and look forward to speaking with you again next year. I, as well as your care team,  appreciate your ongoing commitment to your health goals. Please review the following plan we discussed and let me know if I can assist you in the future. Your Game plan/ To Do List     Follow up Visits: Next Medicare AWV with our clinical staff: 03/24/25 9:40am, telephone.     Next Office Visit with your provider: 03/23/24 10:20am, Dr Antonio Meth.  Clinician Recommendations:  Aim for 30 minutes of exercise or brisk walking, 6-8 glasses of water, and 5 servings of fruits and vegetables each day.       This is a list of the screening recommended for you and due dates:  Health Maintenance  Topic Date Due   Medicare Annual Wellness Visit  Never done   Flu Shot  01/03/2024   DTaP/Tdap/Td vaccine (3 - Td or Tdap) 03/20/2025*   COVID-19 Vaccine (6 - 2025-26 season) 03/20/2025*   Pneumococcal Vaccine for age over 63  Completed   DEXA scan (bone density measurement)  Completed   Zoster (Shingles) Vaccine  Completed   Meningitis B Vaccine  Aged Out   Breast Cancer Screening  Discontinued  *Topic was postponed. The date shown is not the original due date.    Advanced directives: (Copy Requested) Please bring a copy of your health care power of attorney and living will to the office to be added to your chart at your convenience. You can mail to [facility] [address] [place], [place] [address] or email to [email or web address] Advance Care Planning is important because it:  [x]  Makes sure you receive the medical care that is consistent with your values, goals, and preferences  [x]  It provides guidance to your family and loved ones and reduces their decisional burden about whether or not they are making the right decisions based on your  wishes.  Follow the link provided in your after visit summary or read over the paperwork we have mailed to you to help you started getting your Advance Directives in place. If you need assistance in completing these, please reach out to us  so that we can help you!  See attachments for Preventive Care and Fall Prevention Tips.

## 2024-03-20 NOTE — Telephone Encounter (Signed)
 FYI:  Pt had AWV today. Pt cancelled breast biposy for abnormal mammogram last year; doesn't want any further workup or treatment if indicated. Pt has appt on 03/23/24 with PCP.

## 2024-03-20 NOTE — Progress Notes (Addendum)
 Subjective:   Carla Reilly is a 89 y.o. who presents for a Medicare Wellness preventive visit.  As a reminder, Annual Wellness Visits don't include a physical exam, and some assessments may be limited, especially if this visit is performed virtually. We may recommend an in-person follow-up visit with your provider if needed.  Visit Complete: Virtual I connected with  Carla Reilly on 03/20/24 by a audio enabled telemedicine application and verified that I am speaking with the correct person using two identifiers.  Patient Location: Home  Provider Location: Office/Clinic  I discussed the limitations of evaluation and management by telemedicine. The patient expressed understanding and agreed to proceed.  Vital Signs: Because this visit was a virtual/telehealth visit, some criteria may be missing or patient reported. Any vitals not documented were not able to be obtained and vitals that have been documented are patient reported.  VideoDeclined- This patient declined Librarian, academic. Therefore the visit was completed with audio only.  Persons Participating in Visit: Patient.  AWV Questionnaire: No: Patient Medicare AWV questionnaire was not completed prior to this visit.  Cardiac Risk Factors include: advanced age (>35men, >24 women);hypertension;dyslipidemia;obesity (BMI >30kg/m2)     Objective:    Today's Vitals   03/20/24 0905  Weight: 195 lb (88.5 kg)  Height: 5' 4 (1.626 m)   Body mass index is 33.47 kg/m.     03/20/2024    9:20 AM 05/20/2018    2:30 PM 03/16/2015    7:33 AM 03/11/2015   11:51 AM  Advanced Directives  Does Patient Have a Medical Advance Directive? Yes Yes  Yes  Yes   Type of Estate agent of [place];Living will Healthcare Power of [place];Living will Healthcare Power of [place];Living will  Healthcare Power of [place];Living will   Does patient want to make changes to medical advance directive?  No - Patient declined No - Patient declined  No - Patient declined  No - Patient declined   Copy of Healthcare Power of Attorney in Chart? No - copy requested Yes - validated most recent copy scanned in chart (See row information)  No - copy requested  No - copy requested      Data saved with a previous flowsheet row definition    Current Medications (verified) Outpatient Encounter Medications as of 03/20/2024  Medication Sig   levothyroxine  (SYNTHROID ) 88 MCG tablet Take 1 tablet (88 mcg total) by mouth daily before breakfast. Pt needs office visit for further refills   losartan -hydrochlorothiazide  (HYZAAR) 50-12.5 MG tablet Take 1 tablet by mouth daily. Pt needs office visit for further refills   simvastatin  (ZOCOR ) 40 MG tablet TAKE 1 TABLET BY MOUTH AT  BEDTIME   No facility-administered encounter medications on file as of 03/20/2024.    Allergies (verified) Yellow jacket venom [bee venom] and Zestoretic  [lisinopril -hydrochlorothiazide ]   History: Past Medical History:  Diagnosis Date   Carpal tunnel syndrome    Cataracts, bilateral    Hyperlipidemia    Thyroid  disease    Hypothyroidism   Wears dentures    Wears glasses    Past Surgical History:  Procedure Laterality Date   CATARACTS REMOVED OCT & NOV 2017     OCT & NOV 2017   DILATION AND CURETTAGE OF UTERUS  1980   HYSTEROSCOPY WITH D & C N/A 03/16/2015   Procedure: DILATATION AND CURETTAGE /HYSTEROSCOPY;  Surgeon: LELON Tanda Mulch, MD;  Location: [facility];  Service: Gynecology;  Laterality: N/A;   TONSILLECTOMY  TOTAL ABDOMINAL HYSTERECTOMY W/ BILATERAL SALPINGOOPHORECTOMY Bilateral 07/2015   Dr Laymon   Family History  Problem Relation Age of Onset   Hypothyroidism Mother    Dementia Mother        died at 66yo   Prostate cancer Father    Cancer Father        prostate   Dementia Sister    Stroke Sister    Cancer Sister        lung / brain   Lung cancer Sister    Hypertension Brother     Intracerebral hemorrhage Brother    Colon cancer Son 35   Testicular cancer Grandchild    Social History   Socioeconomic History   Marital status: Widowed    Spouse name: Not on file   Number of children: 2   Years of education: 8   Highest education level: Not on file  Occupational History    Employer: [employer]    Comment: retired 12/2014  Tobacco Use   Smoking status: Former    Current packs/day: 0.00    Types: Cigarettes    Quit date: 06/04/1972    Years since quitting: 51.8   Smokeless tobacco: Never  Substance and Sexual Activity   Alcohol use: No   Drug use: No   Sexual activity: Yes    Partners: Male  Other Topics Concern   Not on file  Social History Narrative   Pt lives alone in a house    Social Drivers of Health   Financial Resource Strain: Low Risk  (03/20/2024)   Overall Financial Resource Strain (CARDIA)    Difficulty of Paying Living Expenses: Not very hard  Food Insecurity: No Food Insecurity (03/20/2024)   Hunger Vital Sign    Worried About Running Out of Food in the Last Year: Never true    Ran Out of Food in the Last Year: Never true  Transportation Needs: No Transportation Needs (03/20/2024)   PRAPARE - Administrator, Civil Service (Medical): No    Lack of Transportation (Non-Medical): No  Physical Activity: Insufficiently Active (03/20/2024)   Exercise Vital Sign    Days of Exercise per Week: 7 days    Minutes of Exercise per Session: 20 min  Stress: Stress Concern Present (03/20/2024)   [employer] of Occupational Health - Occupational Stress Questionnaire    Feeling of Stress: To some extent  Social Connections: Socially Isolated (03/20/2024)   Social Connection and Isolation Panel    Frequency of Communication with Friends and Family: Three times a week    Frequency of Social Gatherings with Friends and Family: More than three times a week    Attends Religious Services: Never    Database administrator or Organizations:  No    Attends Banker Meetings: Never    Marital Status: Widowed    Tobacco Counseling Counseling given: Not Answered    Clinical Intake:  Pre-visit preparation completed: Yes  Pain : No/denies pain     BMI - recorded: 33.47 Nutritional Status: BMI > 30  Obese Nutritional Risks: None Diabetes: No  No results found for: HGBA1C   How often do you need to have someone help you when you read instructions, pamphlets, or other written materials from your doctor or pharmacy?: 1 - Never What is the last grade level you completed in school?: some college  Interpreter Needed?: No  Information entered by :: Ayasha Ellingsen, CMA(AAMA)   Activities of Daily Living  03/20/2024    9:10 AM  In your present state of health, do you have any difficulty performing the following activities:  Hearing? 0  Vision? 0  Difficulty concentrating or making decisions? 0  Walking or climbing stairs? 0  Dressing or bathing? 0  Doing errands, shopping? 1  Preparing Food and eating ? N  Using the Toilet? N  In the past six months, have you accidently leaked urine? N  Do you have problems with loss of bowel control? N  Managing your Medications? N  Managing your Finances? N  Housekeeping or managing your Housekeeping? N    Patient Care Team: Antonio Meth, Jamee SAUNDERS, DO as PCP - General  I have updated your Care Teams any recent Medical Services you may have received from other providers in the past year.     Assessment:   This is a routine wellness examination for Carla Reilly.  Hearing/Vision screen Hearing Screening - Comments:: Denies hearing difficulties.  Vision Screening - Comments:: Past due with routine eye exams with [facility] at [facility].   Goals Addressed               This Visit's Progress     Patient Stated (pt-stated)        Plans to use the gym at housing facility when pulled muscle is healed.       Depression Screen     03/20/2024    9:16 AM  06/18/2022    8:30 AM 12/07/2021    8:50 AM 12/08/2020    8:30 AM 04/14/2020    1:16 PM 06/11/2018   11:27 AM 12/27/2015    3:31 PM  PHQ 2/9 Scores  PHQ - 2 Score 0 0 0 0 0 0 0  PHQ- 9 Score 0     0     Fall Risk     03/20/2024    9:20 AM 06/18/2022    8:30 AM 12/07/2021    8:50 AM 12/08/2020    8:30 AM 06/10/2020    8:04 AM  Fall Risk   Falls in the past year? 0 0 0 0 0  Number falls in past yr: 0 0 0 0 0  Injury with Fall? 0 0 0 0 0  Risk for fall due to :   Impaired balance/gait;Impaired mobility    Follow up Education provided Falls evaluation completed  Falls evaluation completed  Falls evaluation completed       Data saved with a previous flowsheet row definition    MEDICARE RISK AT HOME:  Medicare Risk at Home Any stairs in or around the home?: Yes (has elevater) If so, are there any without handrails?: No Home free of loose throw rugs in walkways, pet beds, electrical cords, etc?: Yes Adequate lighting in your home to reduce risk of falls?: Yes Life alert?: No Use of a cane, walker or w/c?: No (cane temporarily while pulled muscle is healing) Grab bars in the bathroom?: Yes Shower chair or bench in shower?: Yes Elevated toilet seat or a handicapped toilet?: Yes  TIMED UP AND GO:  Was the test performed?  No  Cognitive Function: 6CIT completed    12/27/2015    3:52 PM  MMSE - Mini Mental State Exam  Orientation to time 5   Orientation to Place 5   Registration 3   Attention/ Calculation 5   Recall 3   Language- name 2 objects 2   Language- repeat 1  Language- follow 3 step command 3  Language- read & follow direction 1   Write a sentence 1   Copy design 1   Total score 30      Data saved with a previous flowsheet row definition        03/20/2024    9:21 AM  6CIT Screen  What Year? 0 points  What month? 0 points  What time? 0 points  Count back from 20 0 points  Months in reverse 0 points  Repeat phrase 4 points  Total Score 4 points     Immunizations Immunization History  Administered Date(s) Administered   Fluad Quad(high Dose 65+) 03/13/2019, 03/10/2020, 03/29/2022   Fluad Trivalent(High Dose 65+) 04/03/2023   INFLUENZA, HIGH DOSE SEASONAL PF 03/08/2016, 03/12/2017, 03/24/2018   Influenza Split 03/20/2011, 04/03/2012   Influenza Whole 04/04/2006, 04/25/2007, 03/05/2008, 03/18/2009, 03/10/2010   Influenza,inj,Quad PF,6+ Mos 03/31/2013, 03/04/2014, 03/24/2015   Influenza-Unspecified 03/04/2021   PFIZER(Purple Top)SARS-COV-2 Vaccination 07/11/2019, 08/04/2019, 04/11/2020, 09/12/2020   Pfizer Covid-19 Vaccine Bivalent Booster 34yrs & up 06/16/2021, 06/16/2021   Pneumococcal Conjugate-13 12/03/2014   Pneumococcal Polysaccharide-23 06/05/2003   Td 06/04/2001   Tdap 10/16/2013   Zoster Recombinant(Shingrix) 02/26/2017, 09/24/2017   Zoster, Live 10/16/2010    Screening Tests Health Maintenance  Topic Date Due   Influenza Vaccine  01/03/2024   DTaP/Tdap/Td (3 - Td or Tdap) 03/20/2025 (Originally 10/17/2023)   COVID-19 Vaccine (6 - 2025-26 season) 03/20/2025 (Originally 02/03/2024)   Medicare Annual Wellness (AWV)  03/20/2025   Pneumococcal Vaccine: 50+ Years  Completed   DEXA SCAN  Completed   Zoster Vaccines- Shingrix  Completed   Meningococcal B Vaccine  Aged Out   Mammogram  Discontinued    Health Maintenance Items Addressed: Declines DEXA and no further mammogram workup. Declines tetanus vaccine due to previous reaction and declines Covid vaccine at present.   Additional Screening:  Vision Screening: Recommended annual ophthalmology exams for early detection of glaucoma and other disorders of the eye. Is the patient up to date with their annual eye exam?  No  Who is the provider or what is the name of the office in which the patient attends annual eye exams? Will call to schedule somewhere in [place] as she just moved.  Dental Screening: Recommended annual dental exams for proper oral  hygiene  Community Resource Referral / Chronic Care Management: CRR required this visit?  No   CCM required this visit?  No   Plan:    I have personally reviewed and noted the following in the patient's chart:   Medical and social history Use of alcohol, tobacco or illicit drugs  Current medications and supplements including opioid prescriptions. Patient is not currently taking opioid prescriptions. Functional ability and status Nutritional status Physical activity Advanced directives List of other physicians Hospitalizations, surgeries, and ER visits in previous 12 months Vitals Screenings to include cognitive, depression, and falls Referrals and appointments  In addition, I have reviewed and discussed with patient certain preventive protocols, quality metrics, and best practice recommendations. A written personalized care plan for preventive services as well as general preventive health recommendations were provided to patient.   Lolita Libra, CMA   03/20/2024   After Visit Summary: (MyChart) Due to this being a telephonic visit, the after visit summary with patients personalized plan was offered to patient via MyChart   Notes: See phone note

## 2024-03-20 NOTE — Telephone Encounter (Signed)
 Pt has cpe with DR Antonio Meth on 03/23/24.  Pt states her daughter driver her to appt and they were both having appts with Dr Antonio Meth and had them scheduled back to back. She states someone changed her appt a week after it was initially made and they did not call her to tell her about or why the change was made. Now her daughter will have to come for her appt then drive home and come back with the pt for her appt on the same day. I advised pt it may have been due to a scheduling error and she understands but wants to know why someone wouldn't have spoken to her about the change first.

## 2024-03-23 ENCOUNTER — Ambulatory Visit (INDEPENDENT_AMBULATORY_CARE_PROVIDER_SITE_OTHER): Admitting: Family Medicine

## 2024-03-23 ENCOUNTER — Encounter: Payer: Self-pay | Admitting: Family Medicine

## 2024-03-23 VITALS — BP 136/68 | HR 90 | Temp 97.8°F | Resp 16 | Ht 64.0 in | Wt 192.8 lb

## 2024-03-23 DIAGNOSIS — M79651 Pain in right thigh: Secondary | ICD-10-CM

## 2024-03-23 DIAGNOSIS — I10 Essential (primary) hypertension: Secondary | ICD-10-CM

## 2024-03-23 DIAGNOSIS — E039 Hypothyroidism, unspecified: Secondary | ICD-10-CM | POA: Diagnosis not present

## 2024-03-23 DIAGNOSIS — H6121 Impacted cerumen, right ear: Secondary | ICD-10-CM | POA: Diagnosis not present

## 2024-03-23 DIAGNOSIS — Z0000 Encounter for general adult medical examination without abnormal findings: Secondary | ICD-10-CM

## 2024-03-23 DIAGNOSIS — E785 Hyperlipidemia, unspecified: Secondary | ICD-10-CM | POA: Diagnosis not present

## 2024-03-23 DIAGNOSIS — Z23 Encounter for immunization: Secondary | ICD-10-CM | POA: Diagnosis not present

## 2024-03-23 LAB — COMPREHENSIVE METABOLIC PANEL WITH GFR
ALT: 18 U/L (ref 0–35)
AST: 17 U/L (ref 0–37)
Albumin: 4.5 g/dL (ref 3.5–5.2)
Alkaline Phosphatase: 90 U/L (ref 39–117)
BUN: 25 mg/dL — ABNORMAL HIGH (ref 6–23)
CO2: 27 meq/L (ref 19–32)
Calcium: 9.7 mg/dL (ref 8.4–10.5)
Chloride: 105 meq/L (ref 96–112)
Creatinine, Ser: 0.97 mg/dL (ref 0.40–1.20)
GFR: 52.74 mL/min — ABNORMAL LOW (ref >60.00)
Glucose, Bld: 95 mg/dL (ref 70–99)
Potassium: 4.3 meq/L (ref 3.5–5.1)
Sodium: 143 meq/L (ref 135–145)
Total Bilirubin: 0.4 mg/dL (ref 0.2–1.2)
Total Protein: 6.9 g/dL (ref 6.0–8.3)

## 2024-03-23 LAB — CBC WITH DIFFERENTIAL/PLATELET
Basophils Absolute: 0 K/uL (ref 0.0–0.1)
Basophils Relative: 0.5 % (ref 0.0–3.0)
Eosinophils Absolute: 0.1 K/uL (ref 0.0–0.7)
Eosinophils Relative: 1.4 % (ref 0.0–5.0)
HCT: 38.5 % (ref 36.0–46.0)
Hemoglobin: 13.1 g/dL (ref 12.0–15.0)
Lymphocytes Relative: 25.2 % (ref 12.0–46.0)
Lymphs Abs: 1.7 K/uL (ref 0.7–4.0)
MCHC: 34 g/dL (ref 30.0–36.0)
MCV: 93.9 fl (ref 78.0–100.0)
Monocytes Absolute: 0.5 K/uL (ref 0.1–1.0)
Monocytes Relative: 7.6 % (ref 3.0–12.0)
Neutro Abs: 4.3 K/uL (ref 1.4–7.7)
Neutrophils Relative %: 65.3 % (ref 43.0–77.0)
Platelets: 229 K/uL (ref 150.0–400.0)
RBC: 4.1 Mil/uL (ref 3.87–5.11)
RDW: 13.2 % (ref 11.5–15.5)
WBC: 6.6 K/uL (ref 4.0–10.5)

## 2024-03-23 LAB — TSH: TSH: 2.47 u[IU]/mL (ref 0.35–5.50)

## 2024-03-23 LAB — LIPID PANEL
Cholesterol: 150 mg/dL (ref 0–200)
HDL: 62.4 mg/dL (ref >39.00)
LDL Cholesterol: 63 mg/dL (ref 0–99)
NonHDL: 87.59
Total CHOL/HDL Ratio: 2
Triglycerides: 125 mg/dL (ref 0.0–149.0)
VLDL: 25 mg/dL (ref 0.0–40.0)

## 2024-03-23 MED ORDER — MELOXICAM 7.5 MG PO TABS: 7.5000 mg | ORAL_TABLET | Freq: Every day | ORAL | 2 refills | Status: DC

## 2024-03-23 MED ORDER — LEVOTHYROXINE SODIUM 88 MCG PO TABS: 88.0000 ug | ORAL_TABLET | Freq: Every day | ORAL | 3 refills | Status: AC

## 2024-03-23 MED ORDER — SIMVASTATIN 40 MG PO TABS: 40.0000 mg | ORAL_TABLET | Freq: Every day | ORAL | 3 refills | Status: AC

## 2024-03-23 MED ORDER — LOSARTAN POTASSIUM-HCTZ 50-12.5 MG PO TABS: 1.0000 | ORAL_TABLET | Freq: Every day | ORAL | 3 refills | Status: AC

## 2024-03-23 NOTE — Assessment & Plan Note (Signed)
 Ghm utd Check labs  See AVS  Health Maintenance  Topic Date Due   Medicare Annual Wellness (AWV)  03/20/2025   Pneumococcal Vaccine: 50+ Years  Completed   Influenza Vaccine  Completed   DEXA SCAN  Completed   Zoster Vaccines- Shingrix  Completed   Meningococcal B Vaccine  Aged Out   DTaP/Tdap/Td  Discontinued   Mammogram  Discontinued   COVID-19 Vaccine  Discontinued

## 2024-03-23 NOTE — Assessment & Plan Note (Signed)
 Encourage heart healthy diet such as MIND or DASH diet, increase exercise, avoid trans fats, simple carbohydrates and processed foods, consider a krill or fish or flaxseed oil cap daily.

## 2024-03-23 NOTE — Assessment & Plan Note (Signed)
 Well controlled, no changes to meds. Encouraged heart healthy diet such as the DASH diet and exercise as tolerated.

## 2024-03-23 NOTE — Progress Notes (Signed)
 Subjective:    Patient ID: Carla Reilly, female    DOB: Sep 24, 1934, 89 y.o.   MRN: [id number]  Chief Complaint  Patient presents with   Annual Exam    Pt states fasting     HPI Patient is in today for cpe.  Discussed the use of AI scribe software for clinical note transcription with the patient, who gave verbal consent to proceed.  History of Present Illness Carla Reilly is an 89 year old female who presents for medication refills and a flu shot.  She recently moved to a 55 and over retirement community in [place], which is five minutes away from her daughter's residence. She has been living there for two weeks and is adjusting to the new environment. The community offers various amenities such as a gym, movie screenings, card games, and a puzzle room, but does not provide dining services. She cooks her own meals.  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 She reports that after acupuncture treatment, the muscle felt softer and she experienced improvement. She inquires about stronger medication than Motrin for inflammation.  She has a history of a reaction to a tetanus shot, which caused flu-like symptoms, and she is hesitant to receive another one. She does not plan to receive any more COVID shots as she does not go out much or mingle with others.  She uses organic castor oil for ear care, which she claims helps with earwax and ringing.  She is considering selling her car as she has her groceries delivered and does not drive much. Her old neighbor is interested in purchasing the car.    Past Medical History:  Diagnosis Date   Carpal tunnel syndrome    Cataracts, bilateral    Hyperlipidemia    Thyroid  disease    Hypothyroidism   Wears dentures    Wears glasses     Past Surgical History:  Procedure Laterality Date   CATARACTS REMOVED OCT & NOV 2017     OCT & NOV 2017   DILATION AND CURETTAGE OF UTERUS  1980    HYSTEROSCOPY WITH D & C N/A 03/16/2015   Procedure: DILATATION AND CURETTAGE /HYSTEROSCOPY;  Surgeon: LELON Tanda Mulch, MD;  Location: [facility];  Service: Gynecology;  Laterality: N/A;   TONSILLECTOMY     TOTAL ABDOMINAL HYSTERECTOMY W/ BILATERAL SALPINGOOPHORECTOMY Bilateral 07/2015   Dr Laymon    Family History  Problem Relation Age of Onset   Hypothyroidism Mother    Dementia Mother        died at 44yo   Prostate cancer Father    Cancer Father        prostate   Dementia Sister    Stroke Sister    Cancer Sister        lung / brain   Lung cancer Sister    Hypertension Brother    Intracerebral hemorrhage Brother    Colon cancer Son 59   Testicular cancer Grandchild     Social History   Socioeconomic History   Marital status: Widowed    Spouse name: Not on file   Number of children: 2   Years of education: 63   Highest education level: Not on file  Occupational History    Employer: [employer]    Comment: retired 12/2014  Tobacco Use   Smoking status: Former    Current packs/day: 0.00    Types: Cigarettes    Quit date: 06/04/1972  Years since quitting: 51.8   Smokeless tobacco: Never  Substance and Sexual Activity   Alcohol use: No   Drug use: No   Sexual activity: Yes    Partners: Male  Other Topics Concern   Not on file  Social History Narrative   Pt moved into independent living apartment in [place] --- daughter is close by   Social Drivers of Health   Financial Resource Strain: Low Risk  (03/20/2024)   Overall Financial Resource Strain (CARDIA)    Difficulty of Paying Living Expenses: Not very hard  Food Insecurity: No Food Insecurity (03/20/2024)   Hunger Vital Sign    Worried About Running Out of Food in the Last Year: Never true    Ran Out of Food in the Last Year: Never true  Transportation Needs: No Transportation Needs (03/20/2024)   PRAPARE - Administrator, Civil Service (Medical): No    Lack of Transportation  (Non-Medical): No  Physical Activity: Insufficiently Active (03/20/2024)   Exercise Vital Sign    Days of Exercise per Week: 7 days    Minutes of Exercise per Session: 20 min  Stress: Stress Concern Present (03/20/2024)   [employer] of Occupational Health - Occupational Stress Questionnaire    Feeling of Stress: To some extent  Social Connections: Socially Isolated (03/20/2024)   Social Connection and Isolation Panel    Frequency of Communication with Friends and Family: Three times a week    Frequency of Social Gatherings with Friends and Family: More than three times a week    Attends Religious Services: Never    Database administrator or Organizations: No    Attends Banker Meetings: Never    Marital Status: Widowed  Intimate Partner Violence: Not At Risk (03/20/2024)   Humiliation, Afraid, Rape, and Kick questionnaire    Fear of Current or Ex-Partner: No    Emotionally Abused: No    Physically Abused: No    Sexually Abused: No    Outpatient Medications Prior to Visit  Medication Sig Dispense Refill   levothyroxine  (SYNTHROID ) 88 MCG tablet Take 1 tablet (88 mcg total) by mouth daily before breakfast. Pt needs office visit for further refills 30 tablet 0   losartan -hydrochlorothiazide  (HYZAAR) 50-12.5 MG tablet Take 1 tablet by mouth daily. Pt needs office visit for further refills 30 tablet 0   simvastatin  (ZOCOR ) 40 MG tablet TAKE 1 TABLET BY MOUTH AT  BEDTIME 90 tablet 3   No facility-administered medications prior to visit.    Allergies  Allergen Reactions   Yellow Jacket Venom [Bee Venom] Hives   Zestoretic  [Lisinopril -Hydrochlorothiazide ] Cough    Review of Systems  Constitutional:  Negative for fever and malaise/fatigue.  HENT:  Positive for hearing loss. Negative for congestion.   Eyes:  Negative for blurred vision.  Respiratory:  Negative for cough and shortness of breath.   Cardiovascular:  Negative for chest pain, palpitations and leg  swelling.  Gastrointestinal:  Negative for vomiting.  Musculoskeletal:  Negative for back pain.  Skin:  Negative for rash.  Neurological:  Negative for loss of consciousness and headaches.       Objective:    Physical Exam Vitals and nursing note reviewed.  Constitutional:      General: She is not in acute distress.    Appearance: Normal appearance. She is well-developed.  HENT:     Head: Normocephalic and atraumatic.  Eyes:     General: No scleral icterus.  Right eye: No discharge.        Left eye: No discharge.  Cardiovascular:     Rate and Rhythm: Normal rate and regular rhythm.     Heart sounds: No murmur heard. Pulmonary:     Effort: Pulmonary effort is normal. No respiratory distress.     Breath sounds: Normal breath sounds.  Musculoskeletal:        General: Normal range of motion.     Cervical back: Normal range of motion and neck supple.     Right lower leg: No edema.     Left lower leg: No edema.  Skin:    General: Skin is warm and dry.  Neurological:     Mental Status: She is alert and oriented to person, place, and time.  Psychiatric:        Mood and Affect: Mood normal.        Behavior: Behavior normal.        Thought Content: Thought content normal.        Judgment: Judgment normal.     BP 136/68 (BP Location: Right Arm, Patient Position: Sitting, Cuff Size: Large)   Pulse 90   Temp 97.8 F (36.6 C) (Oral)   Resp 16   Ht 5' 4 (1.626 m)   Wt 192 lb 12.8 oz (87.5 kg)   SpO2 97%   BMI 33.09 kg/m  Wt Readings from Last 3 Encounters:  03/23/24 192 lb 12.8 oz (87.5 kg)  03/20/24 195 lb (88.5 kg)  12/17/22 206 lb (93.4 kg)    Diabetic Foot Exam - Simple   No data filed    Lab Results  Component Value Date   WBC 5.6 12/17/2022   HGB 13.2 12/17/2022   HCT 38.6 12/17/2022   PLT 201.0 12/17/2022   GLUCOSE 101 (H) 12/17/2022   CHOL 148 12/17/2022   TRIG 139.0 12/17/2022   HDL 56.90 12/17/2022   LDLCALC 63 12/17/2022   ALT 15 12/17/2022    AST 16 12/17/2022   NA 139 12/17/2022   K 3.9 12/17/2022   CL 104 12/17/2022   CREATININE 0.97 12/17/2022   BUN 19 12/17/2022   CO2 27 12/17/2022   TSH 1.94 12/17/2022    Lab Results  Component Value Date   TSH 1.94 12/17/2022   Lab Results  Component Value Date   WBC 5.6 12/17/2022   HGB 13.2 12/17/2022   HCT 38.6 12/17/2022   MCV 92.6 12/17/2022   PLT 201.0 12/17/2022   Lab Results  Component Value Date   NA 139 12/17/2022   K 3.9 12/17/2022   CO2 27 12/17/2022   GLUCOSE 101 (H) 12/17/2022   BUN 19 12/17/2022   CREATININE 0.97 12/17/2022   BILITOT 0.6 12/17/2022   ALKPHOS 83 12/17/2022   AST 16 12/17/2022   ALT 15 12/17/2022   PROT 6.5 12/17/2022   ALBUMIN 4.1 12/17/2022   CALCIUM 9.5 12/17/2022   GFR 53.21 (L) 12/17/2022   Lab Results  Component Value Date   CHOL 148 12/17/2022   Lab Results  Component Value Date   HDL 56.90 12/17/2022   Lab Results  Component Value Date   LDLCALC 63 12/17/2022   Lab Results  Component Value Date   TRIG 139.0 12/17/2022   Lab Results  Component Value Date   CHOLHDL 3 12/17/2022   No results found for: HGBA1C     Assessment & Plan:  Need for influenza vaccination -     Flu vaccine HIGH DOSE PF(Fluzone Trivalent)  Hypothyroidism, unspecified type -     Levothyroxine  Sodium; Take 1 tablet (88 mcg total) by mouth daily before breakfast. Pt needs office visit for further refills  Dispense: 90 tablet; Refill: 3 -     TSH  Essential hypertension Assessment & Plan: Well controlled, no changes to meds. Encouraged heart healthy diet such as the DASH diet and exercise as tolerated.    Orders: -     Losartan  Potassium-HCTZ; Take 1 tablet by mouth daily. Pt needs office visit for further refills  Dispense: 90 tablet; Refill: 3 -     CBC with Differential/Platelet -     Comprehensive metabolic panel with GFR -     Lipid panel  Hyperlipidemia Assessment & Plan: Encourage heart healthy diet such as MIND or  DASH diet, increase exercise, avoid trans fats, simple carbohydrates and processed foods, consider a krill or fish or flaxseed oil cap daily.    Orders: -     Simvastatin ; Take 1 tablet (40 mg total) by mouth at bedtime.  Dispense: 90 tablet; Refill: 3 -     Comprehensive metabolic panel with GFR -     Lipid panel  Impacted cerumen of right ear  Pain in right thigh -     Meloxicam; Take 1 tablet (7.5 mg total) by mouth daily.  Dispense: 30 tablet; Refill: 2  Preventative health care Assessment & Plan: Ghm utd Check labs  See AVS  Health Maintenance  Topic Date Due   Medicare Annual Wellness (AWV)  03/20/2025   Pneumococcal Vaccine: 50+ Years  Completed   Influenza Vaccine  Completed   DEXA SCAN  Completed   Zoster Vaccines- Shingrix  Completed   Meningococcal B Vaccine  Aged Out   DTaP/Tdap/Td  Discontinued   Mammogram  Discontinued   COVID-19 Vaccine  Discontinued      Assessment and Plan Assessment & Plan Right thigh muscle strain   Right thigh muscle strain has significantly improved, with 96% improvement reported after acupuncture. Initial muscle tension has softened post-treatment. Prescribe a stronger anti-inflammatory medication than Motrin.  Essential hypertension   Refill losartan  prescription.  Hypothyroidism   Refill Synthroid  prescription.  Hyperlipidemia   Refill simvastatin  prescription.  General Health Maintenance   She will receive a flu shot today. Prefers not to receive a tetanus shot due to previous flu-like symptoms. Not planning further COVID-19 vaccinations due to limited social interaction. Administer flu shot today.    Delawrence Fridman R Lowne Chase, DO

## 2024-03-29 ENCOUNTER — Ambulatory Visit: Payer: Self-pay | Admitting: Family Medicine

## 2024-04-20 ENCOUNTER — Telehealth: Payer: Self-pay

## 2024-04-20 ENCOUNTER — Other Ambulatory Visit: Payer: Self-pay | Admitting: Family Medicine

## 2024-04-20 DIAGNOSIS — M79651 Pain in right thigh: Secondary | ICD-10-CM

## 2024-04-20 MED ORDER — MELOXICAM 15 MG PO TABS: 15.0000 mg | ORAL_TABLET | Freq: Every day | ORAL | 1 refills | Status: DC

## 2024-04-20 NOTE — Telephone Encounter (Signed)
 Copied from CRM #[id number]. Topic: Clinical - Medication Question >> Apr 20, 2024  1:05 PM Aleatha C wrote: Reason for CRM: Patient want to know if she can get her meloxicam (MOBIC) 7.5 MG tablet  increased, she's been taking it for while and it has not given her any relief and if possible have it sent to  [facility] - [place], [place] - [address] [facility] [address] [place] [address] [place] [address] Phone: [phone] Fax: [phone] Hours: Not open 24 hours  Also reach out to her if this can or can not be done at [phone]

## 2024-04-20 NOTE — Telephone Encounter (Signed)
 Spoke w/ Pt- informed that meloxicam 15mg  has been sent. Pt verbalized understanding.

## 2024-05-20 ENCOUNTER — Telehealth: Payer: Self-pay

## 2024-05-20 NOTE — Telephone Encounter (Signed)
 Carla Reilly, Carla Reilly (Patient)   Subject  Carla Reilly, Carla Reilly (Patient)   Topic  Clinical - Medication Question    Communication  Reason for CRM: Patient stated that she was adv to tell the doctor how the medication meloxicam  (MOBIC ) 15 MG tablet was working for her and she rated it a  7

## 2024-05-21 ENCOUNTER — Other Ambulatory Visit: Payer: Self-pay | Admitting: Family Medicine

## 2024-05-21 DIAGNOSIS — M79651 Pain in right thigh: Secondary | ICD-10-CM

## 2024-05-21 MED ORDER — MELOXICAM 15 MG PO TABS: 15.0000 mg | ORAL_TABLET | Freq: Every day | ORAL | 1 refills | Status: AC

## 2024-05-21 NOTE — Telephone Encounter (Signed)
 Patient reports he pain is still 7 out of 10 with the medication.

## 2024-05-21 NOTE — Telephone Encounter (Signed)
 Patient declines referral at this time, she will like to continue taking the Melixican and she will add some tylenol daily.

## 2024-05-22 ENCOUNTER — Other Ambulatory Visit: Payer: Self-pay | Admitting: Family Medicine

## 2024-09-21 ENCOUNTER — Ambulatory Visit: Admitting: Family Medicine

## 2025-03-24 ENCOUNTER — Ambulatory Visit
